# Patient Record
Sex: Male | Born: 1985 | Race: White | Hispanic: No | Smoking: Former smoker
Health system: Southern US, Community
[De-identification: ages and names within clinical notes are randomized; demographics above are authoritative.]

## PROBLEM LIST (undated history)

## (undated) DIAGNOSIS — M549 Dorsalgia, unspecified: Secondary | ICD-10-CM

## (undated) DIAGNOSIS — G4730 Sleep apnea, unspecified: Secondary | ICD-10-CM

## (undated) DIAGNOSIS — I10 Essential (primary) hypertension: Secondary | ICD-10-CM

## (undated) DIAGNOSIS — E782 Mixed hyperlipidemia: Secondary | ICD-10-CM

## (undated) DIAGNOSIS — M2550 Pain in unspecified joint: Secondary | ICD-10-CM

## (undated) DIAGNOSIS — M519 Unspecified thoracic, thoracolumbar and lumbosacral intervertebral disc disorder: Secondary | ICD-10-CM

## (undated) HISTORY — DX: Pain in unspecified joint: M25.50

## (undated) HISTORY — PX: KNEE SURGERY: SHX244

## (undated) HISTORY — PX: OTHER SURGICAL HISTORY: SHX169

## (undated) HISTORY — DX: Mixed hyperlipidemia: E78.2

## (undated) HISTORY — DX: Unspecified thoracic, thoracolumbar and lumbosacral intervertebral disc disorder: M51.9

## (undated) HISTORY — DX: Dorsalgia, unspecified: M54.9

## (undated) HISTORY — DX: Essential (primary) hypertension: I10

## (undated) HISTORY — DX: Sleep apnea, unspecified: G47.30

---

## 2011-09-12 ENCOUNTER — Ambulatory Visit
Admission: RE | Admit: 2011-09-12 | Discharge: 2011-09-12 | Disposition: A | Discharge status: Home / Self Care | Payer: PRIVATE HEALTH INSURANCE | Source: Ambulatory Visit | Attending: Occupational Medicine | Admitting: Occupational Medicine

## 2011-09-12 ENCOUNTER — Other Ambulatory Visit: Payer: Self-pay | Admitting: Occupational Medicine

## 2011-09-12 DIAGNOSIS — Z021 Encounter for pre-employment examination: Secondary | ICD-10-CM

## 2016-01-14 ENCOUNTER — Emergency Department (HOSPITAL_COMMUNITY): Payer: Worker's Compensation

## 2016-01-14 ENCOUNTER — Encounter (HOSPITAL_COMMUNITY): Payer: Self-pay | Admitting: Emergency Medicine

## 2016-01-14 ENCOUNTER — Emergency Department (HOSPITAL_COMMUNITY)
Admission: EM | Admit: 2016-01-14 | Discharge: 2016-01-14 | Disposition: A | Discharge status: Home / Self Care | Payer: Worker's Compensation | Attending: Emergency Medicine | Admitting: Emergency Medicine

## 2016-01-14 DIAGNOSIS — X58XXXA Exposure to other specified factors, initial encounter: Secondary | ICD-10-CM | POA: Diagnosis not present

## 2016-01-14 DIAGNOSIS — Z87891 Personal history of nicotine dependence: Secondary | ICD-10-CM | POA: Insufficient documentation

## 2016-01-14 DIAGNOSIS — Y9289 Other specified places as the place of occurrence of the external cause: Secondary | ICD-10-CM | POA: Diagnosis not present

## 2016-01-14 DIAGNOSIS — Y998 Other external cause status: Secondary | ICD-10-CM | POA: Diagnosis not present

## 2016-01-14 DIAGNOSIS — S8992XA Unspecified injury of left lower leg, initial encounter: Secondary | ICD-10-CM | POA: Diagnosis present

## 2016-01-14 DIAGNOSIS — S82102A Unspecified fracture of upper end of left tibia, initial encounter for closed fracture: Secondary | ICD-10-CM | POA: Diagnosis not present

## 2016-01-14 DIAGNOSIS — Y9339 Activity, other involving climbing, rappelling and jumping off: Secondary | ICD-10-CM | POA: Diagnosis not present

## 2016-01-14 DIAGNOSIS — M25562 Pain in left knee: Secondary | ICD-10-CM

## 2016-01-14 DIAGNOSIS — S82142A Displaced bicondylar fracture of left tibia, initial encounter for closed fracture: Secondary | ICD-10-CM

## 2016-01-14 MED ORDER — METHOCARBAMOL 500 MG PO TABS
1000.0000 mg | ORAL_TABLET | Freq: Once | ORAL | Status: AC
  Administered 2016-01-14: 1000 mg via ORAL
  Filled 2016-01-14: qty 2

## 2016-01-14 MED ORDER — KETOROLAC TROMETHAMINE 60 MG/2ML IM SOLN
60.0000 mg | Freq: Once | INTRAMUSCULAR | Status: AC
  Administered 2016-01-14: 60 mg via INTRAMUSCULAR
  Filled 2016-01-14: qty 2

## 2016-01-14 MED ORDER — IOPAMIDOL (ISOVUE-370) INJECTION 76%: 100.0000 mL | Freq: Once | INTRAVENOUS | Status: DC | PRN

## 2016-01-14 MED ORDER — NAPROXEN 500 MG PO TABS: 500.0000 mg | ORAL_TABLET | Freq: Two times a day (BID) | ORAL | Status: DC

## 2016-01-14 MED ORDER — OXYCODONE-ACETAMINOPHEN 5-325 MG PO TABS: 1.0000 | ORAL_TABLET | ORAL | Status: DC | PRN

## 2016-01-14 MED ORDER — METHOCARBAMOL 500 MG PO TABS: 500.0000 mg | ORAL_TABLET | Freq: Two times a day (BID) | ORAL | Status: DC

## 2016-01-14 NOTE — ED Provider Notes (Signed)
CSN: [id number]     Arrival date & time 01/14/16  1853 History   First MD Initiated Contact with Patient 01/14/16 1857     Chief Complaint  Patient presents with  . Knee Injury     (Consider location/radiation/quality/duration/timing/severity/associated sxs/prior Treatment) HPI   Jonathan GenerousRobert Hutchinson is a 30 y.o. male, patient with no pertinent past medical history, presenting to the ED with Left knee injury that occurred just prior to arrival. 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 Patient denies previous injury or surgery to this knee. Patient rates his pain at 2 out of 10 when he is not moving, aching, nonradiating. Patient states his pain increases to a 6 or 7 out of 10 upon weight-bearing or movement. Patient denies neuro deficits, head trauma, or any other injuries or complaints.  History reviewed. No pertinent past medical history. Past Surgical History  Procedure Laterality Date  . Testical removal     History reviewed. No pertinent family history. Social History  Substance Use Topics  . Smoking status: Former Games developermoker  . Smokeless tobacco: Never Used  . Alcohol Use: Yes    Review of Systems  Musculoskeletal: Positive for arthralgias (left knee). Negative for back pain and neck pain.  Neurological: Negative for weakness and numbness.      Allergies  Review of patient's allergies indicates no known allergies.  Home Medications   Prior to Admission medications   Medication Sig Start Date End Date Taking? Authorizing Provider  methocarbamol (ROBAXIN) 500 MG tablet Take 1 tablet (500 mg total) by mouth 2 (two) times daily. 01/14/16   Hye Trawick C Forever Arechiga, PA-C  naproxen (NAPROSYN) 500 MG tablet Take 1 tablet (500 mg total) by mouth 2 (two) times daily. 01/14/16   Kaelyn Nauta C Cincere Deprey, PA-C  oxyCODONE-acetaminophen (PERCOCET/ROXICET) 5-325 MG tablet Take 1 tablet  by mouth every 4 (four) hours as needed for severe pain. 01/14/16   Emilene Roma C Porchia Sinkler, PA-C   BP 126/79 mmHg  Pulse 123  Temp(Src) 99 F (37.2 C) (Oral)  Resp 18  SpO2 96% Physical Exam  Constitutional: He appears well-developed and well-nourished. No distress.  HENT:  Head: Normocephalic and atraumatic.  Eyes: Conjunctivae are normal.  Neck: Normal range of motion. Neck supple.  Cardiovascular: Normal rate, regular rhythm and intact distal pulses.   Pulmonary/Chest: Effort normal. No respiratory distress.  Musculoskeletal: He exhibits edema and tenderness.  Tenderness and swelling to the medial aspect of the right knee. Severe pain with valgus stress. No discernible deformity, crepitus, or wounds. Range of motion limited by pain. Circulation intact distally. Patient is nonweightbearing due to pain, even with neither valgus or varus stresses are applied.  Neurological: He is alert.  No sensory deficits. Strength 5 out of 5.  Skin: Skin is warm and dry. He is not diaphoretic.  Psychiatric: He has a normal mood and affect. His behavior is normal.  Nursing note and vitals reviewed.   ED Course  Procedures (including critical care time)  Imaging Review Ct Knee Left Wo Contrast  01/14/2016  CLINICAL DATA:  Status post fall onto left knee, with left knee pain and swelling. Initial encounter. EXAM: CT OF THE LEFT KNEE WITHOUT CONTRAST TECHNIQUE: Multidetector CT imaging of the left knee was performed according to the standard protocol. Multiplanar CT image reconstructions were also generated. COMPARISON:  Left knee radiographs performed earlier  today at 7:38 p.m. FINDINGS: There are multiple small fractures involving the proximal tibia and fibula. Numerous small fragments are seen arising from the anterior aspect of the medial tibial plateau, and a small osseous fragment is seen arising from the posterior aspect of the central edge of the medial tibial plateau. There is also a small avulsion fracture  arising from the lateral aspect of the proximal tibia, along a distal insertion of the lateral collateral ligament complex. In addition, small osseous fragments are seen arising from the fibular head, likely also reflecting lateral collateral ligament complex injury. There is mild cortical irregularity and flattening along the anterior aspect of the lateral tibial plateau, which may reflect remote injury. An associated moderate lipohemarthrosis is noted. Mild soft tissue injury is noted tracking about the patellar retinaculum. The vasculature is not fully assessed, but appears grossly unremarkable. The quadriceps and patellar tendons are grossly unremarkable. The menisci are not well assessed on this study. IMPRESSION: 1. Multiple small fractures involving the proximal tibia and fibula. 2. Numerous small fragments arising from the anterior aspect of the medial tibial plateau, and small osseous fragment arising from the posterior aspect of the central edge of the medial tibial plateau. Small avulsion fracture at the lateral aspect of the proximal tibia, along the distal insertion of the lateral collateral ligament complex. Small osseous fragments arising from the fibular head likely also reflect lateral collateral ligament complex injury. 3. Mild cortical irregularity and flattening along the anterior aspect of the lateral tibial plateau, which may reflect remote injury. 4. Moderate lipohemarthrosis noted. 5. Mild soft tissue injury about the patellar retinaculum. Electronically Signed   By: Roanna Raider M.D.   On: 01/14/2016 22:06   Dg Knee Complete 4 Views Left  01/14/2016  CLINICAL DATA:  Knee pain unable to bear weight. EXAM: LEFT KNEE - COMPLETE 4+ VIEW COMPARISON:  None. FINDINGS: No acute fracture or dislocation. Large joint effusion. No lytic or sclerotic osseous lesion. IMPRESSION: 1. No acute osseous injury of the left knee. 2. Large joint effusion. Electronically Signed   By: Elige Ko   On:  01/14/2016 19:43   I have personally reviewed and evaluated these images as part of my medical decision-making.   EKG Interpretation None      Medications  ketorolac (TORADOL) injection 60 mg (60 mg Intramuscular Given 01/14/16 1907)  methocarbamol (ROBAXIN) tablet 1,000 mg (1,000 mg Oral Given 01/14/16 1914)    MDM   Final diagnoses:  Left knee pain  Left knee injury, initial encounter  Tibial plateau fracture, left, closed, initial encounter    Jonathan Generous presents with left knee injury that occurred just prior to arrival.  Findings and plan of care discussed with Donnetta Hutching, MD. Dr. Adriana Simas personally evaluated and examined this patient.  Patient's physical exam gives concern for ligamentous injury or fracture. After the patient's exam, his lower leg muscles began to spasm painfully. Robaxin relieved these spasms. X-ray shows no fracture or dislocation, but shows a large joint effusion. Patient is still nonweightbearing due to pain. For this reason, clinician suspicion, and his other physical exam findings I believe this patient warrants a CT of the knee here in the ED. CT shows multiple small fractures throughout the proximal tibia. Dr. Adriana Simas recommends knee immobilizer and orthopedic follow-up. Patient advised to follow-up with orthopedics. Placed in knee immobilizer, given crutches, and prescriptions for pain management. Home care and return precautions discussed. Patient voiced understanding of these instructions and is comfortable with discharge. Note: Upon  discharge evaluation, patient was noted to be tachycardic. Patient was given a repeat physical exam and patient still has distal pulses in the left foot. The left knee has not increased in swelling. Compartments are soft. No evidence of internal hemorrhage. Patient denies lightheadedness/dizziness, nausea, or feeling abnormally. Patient denies pain. Suspect that the patient's pulse is increased due to lack of oral intake for the  multiple hours he has been here in the ED.  Filed Vitals:   01/14/16 1858 01/14/16 2256  BP: 146/99 126/79  Pulse: 90 123  Temp: 99.8 F (37.7 C) 99 F (37.2 C)  TempSrc: Oral   Resp: 14 18  SpO2: 95% 96%       Anselm Pancoast, PA-C 01/14/16 2231  Anselm Pancoast, PA-C 01/14/16 2307  Donnetta Hutching, MD 01/15/16 1600

## 2016-01-14 NOTE — ED Notes (Signed)
Pt in radiology 

## 2016-01-14 NOTE — Discharge Instructions (Signed)
You have been seen today for a knee injury. There are multiple small fractures in your lower leg bone. You must wear the immobilizer at all times unless you are dressing. Elevate the extremity, apply ice, and use anti-inflammatory medication such as naproxen or ibuprofen. Follow-up with orthopedics as soon as possible. Use the number provided to call and set up an appointment. Follow up with PCP as needed. Return to ED should symptoms worsen.

## 2016-01-14 NOTE — ED Notes (Signed)
Per Jonathan Hutchinson pt okay to discharge

## 2016-01-14 NOTE — ED Notes (Signed)
Pt [facility] [employer] with c/o left knee injury and pain s/p chasing a suspect.  Pt reports landing hard after jumping and hearing a "popping/crushing" noise followed by being unable to bear weight on the left leg.  Pt in NAD, A&O.

## 2016-10-25 IMAGING — CT CT KNEE*L* W/O CM
3 series · 14 of 33 positions shown, 17 images · non-contrast
Comparison: Left knee radiographs performed earlier today at [DATE]
p.m.

CLINICAL DATA: Status post fall onto left knee, with left knee pain
and swelling. Initial encounter.

EXAM:
CT OF THE LEFT KNEE WITHOUT CONTRAST
TECHNIQUE: Multidetector CT imaging of the left knee was performed according to
the standard protocol. Multiplanar CT image reconstructions were
also generated.

[Series 5: lower ext 1.5 st · axial · 0.46mm/px · z∈[-304,-107]mm · 6 of 172 slices shown, 8 images]
[im 27/172  soft-tissue]
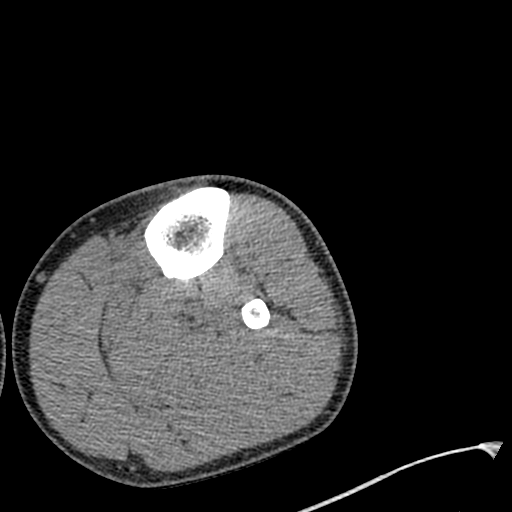
[im 27/172  bone]
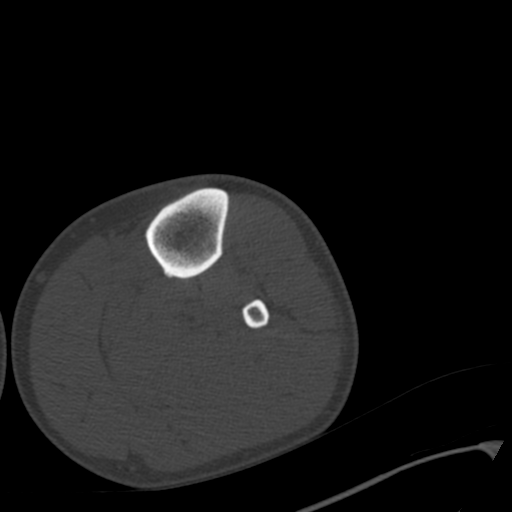
[im 53/172  bone]
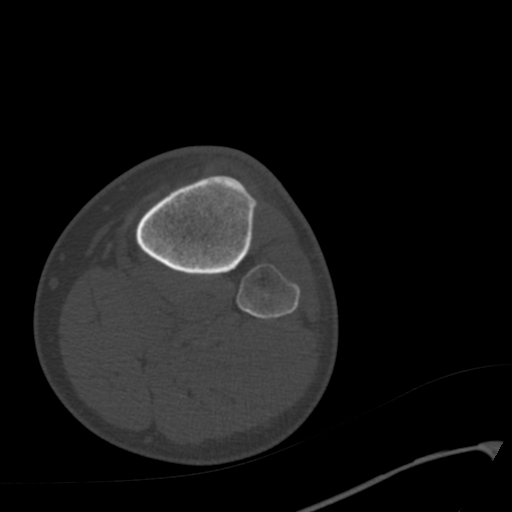
[im 79/172  bone]
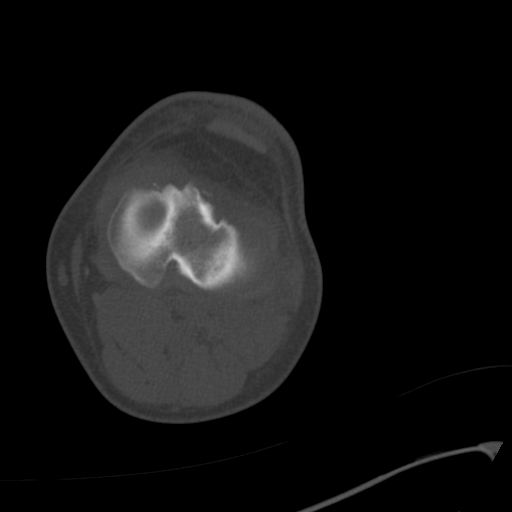
[im 106/172  bone]
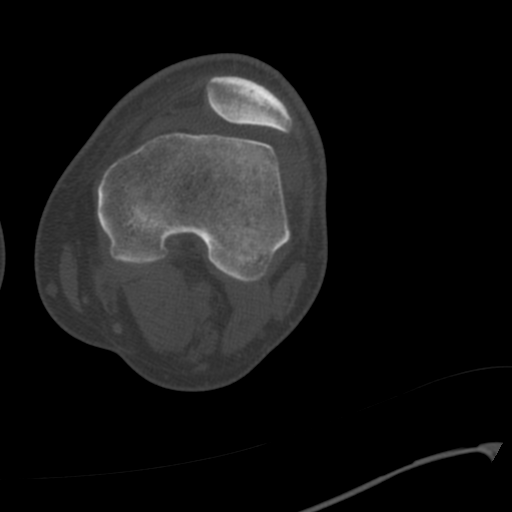
[im 132/172  soft-tissue]
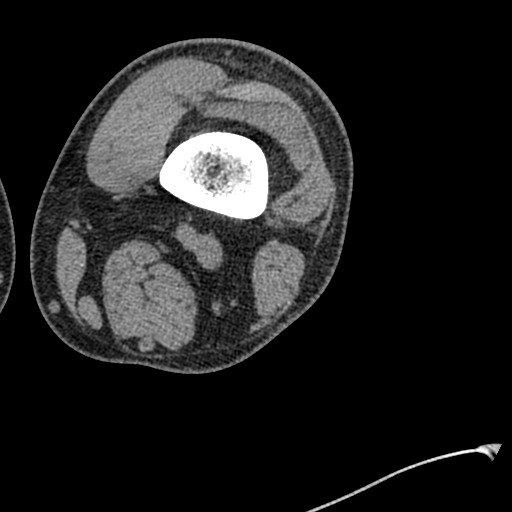
[im 132/172  bone]
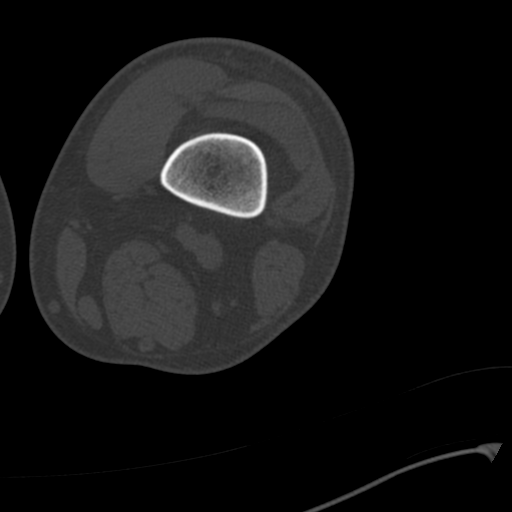
[im 158/172  bone]
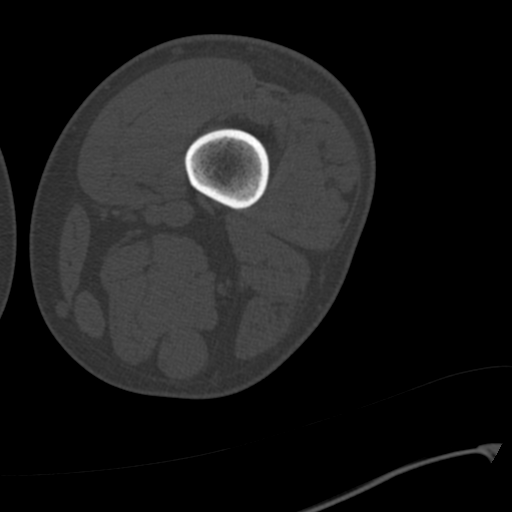

[Series 10: lower ext cor st · coronal · 0.35mm/px · 3 of 102 slices shown]
[im 21/102  bone]
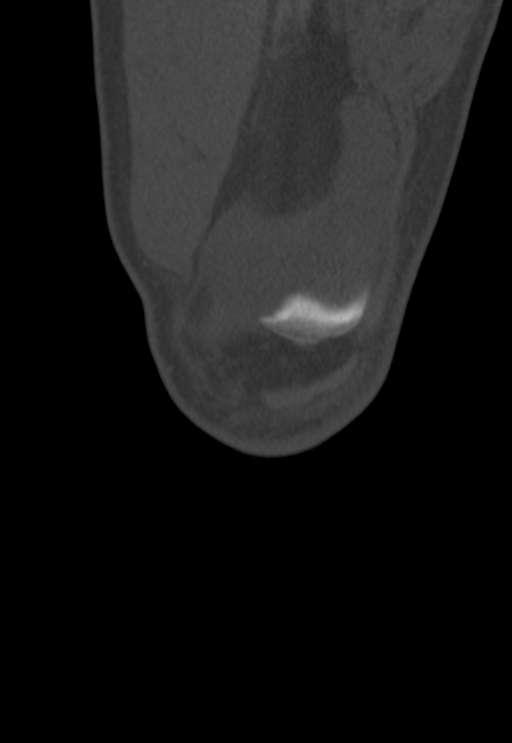
[im 41/102  bone]
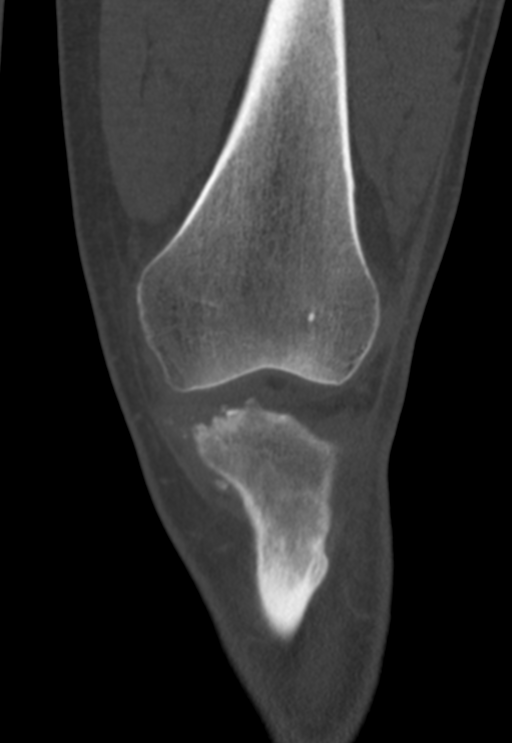
[im 61/102  bone]
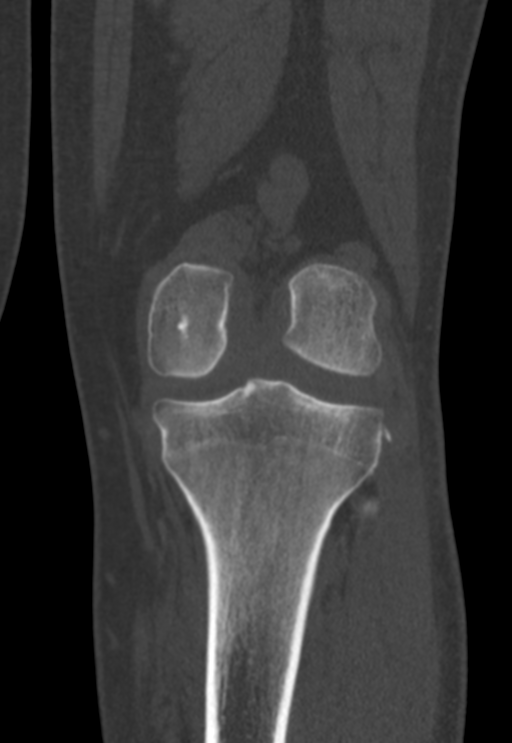

[Series 11: lower ext sag st · sagittal · 0.47mm/px · 5 of 95 slices shown, 6 images]
[im 32/95  bone]
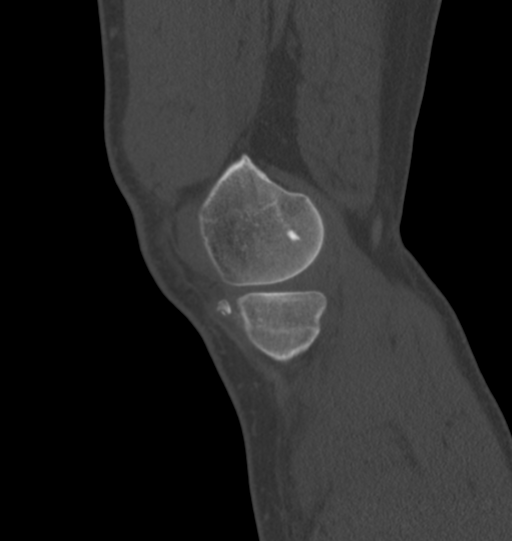
[im 40/95  bone]
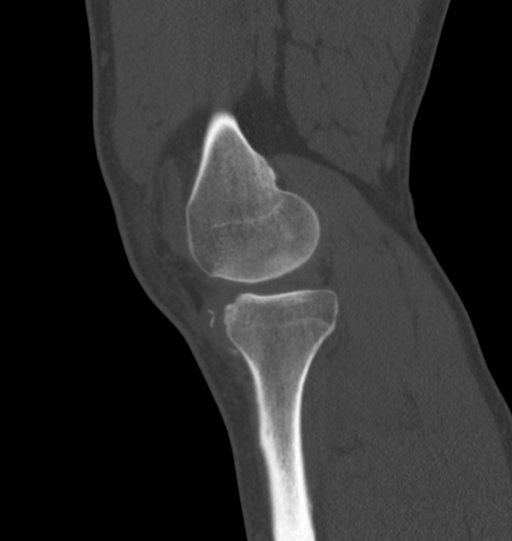
[im 48/95  soft-tissue]
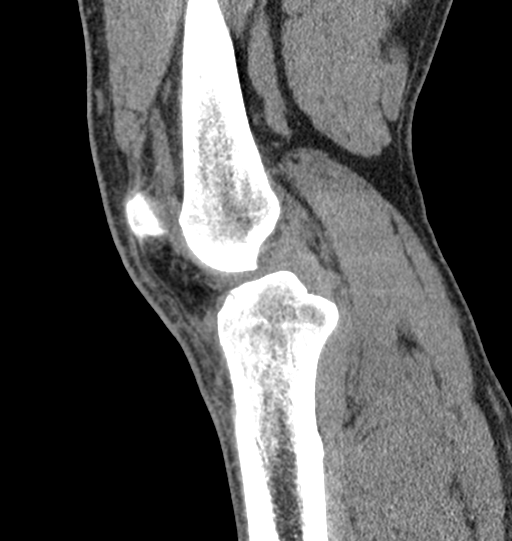
[im 48/95  bone]
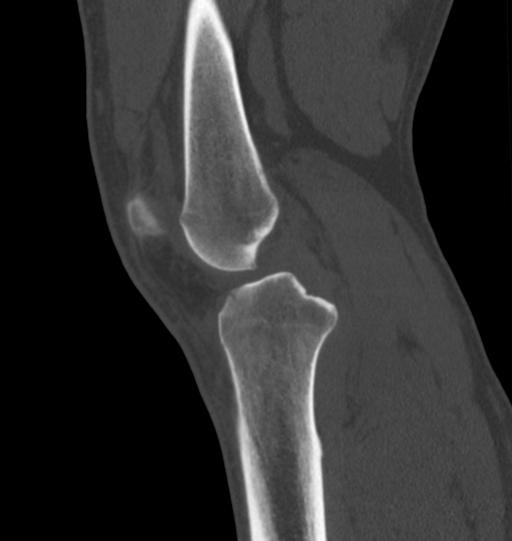
[im 55/95  bone]
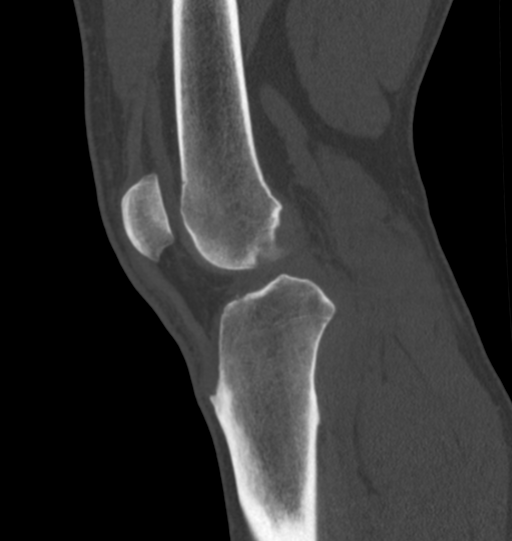
[im 63/95  bone]
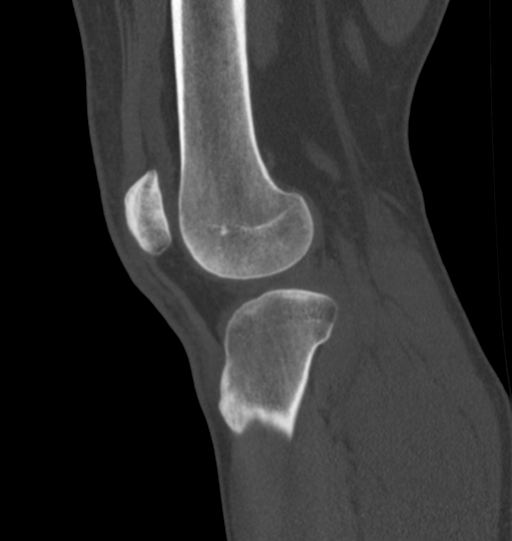

[14 of 33 positions shown; findings below may reference images not displayed]

FINDINGS: There are multiple small fractures involving the proximal tibia and
fibula. Numerous small fragments are seen arising from the anterior
aspect of the medial tibial plateau, and a small osseous fragment is
seen arising from the posterior aspect of the central edge of the
medial tibial plateau. There is also a small avulsion fracture
arising from the lateral aspect of the proximal tibia, along a
distal insertion of the lateral collateral ligament complex. In
addition, small osseous fragments are seen arising from the fibular
head, likely also reflecting lateral collateral ligament complex
injury.

There is mild cortical irregularity and flattening along the
anterior aspect of the lateral tibial plateau, which may reflect
remote injury. An associated moderate lipohemarthrosis is noted.

Mild soft tissue injury is noted tracking about the patellar
retinaculum. The vasculature is not fully assessed, but appears
grossly unremarkable. The quadriceps and patellar tendons are
grossly unremarkable. The menisci are not well assessed on this
study.
IMPRESSION: 1. Multiple small fractures involving the proximal tibia and fibula.
2. Numerous small fragments arising from the anterior aspect of the
medial tibial plateau, and small osseous fragment arising from the
posterior aspect of the central edge of the medial tibial plateau.
Small avulsion fracture at the lateral aspect of the proximal tibia,
along the distal insertion of the lateral collateral ligament
complex. Small osseous fragments arising from the fibular head
likely also reflect lateral collateral ligament complex injury.
3. Mild cortical irregularity and flattening along the anterior
aspect of the lateral tibial plateau, which may reflect remote
injury.
4. Moderate lipohemarthrosis noted.
5. Mild soft tissue injury about the patellar retinaculum.

## 2019-06-04 ENCOUNTER — Other Ambulatory Visit: Payer: Self-pay

## 2019-06-04 ENCOUNTER — Emergency Department (HOSPITAL_COMMUNITY)
Admission: EM | Admit: 2019-06-04 | Discharge: 2019-06-04 | Disposition: A | Discharge status: Home / Self Care | Payer: No Typology Code available for payment source | Attending: Emergency Medicine | Admitting: Emergency Medicine

## 2019-06-04 ENCOUNTER — Encounter (HOSPITAL_COMMUNITY): Payer: Self-pay | Admitting: Emergency Medicine

## 2019-06-04 DIAGNOSIS — Y990 Civilian activity done for income or pay: Secondary | ICD-10-CM | POA: Diagnosis not present

## 2019-06-04 DIAGNOSIS — Z23 Encounter for immunization: Secondary | ICD-10-CM | POA: Diagnosis not present

## 2019-06-04 DIAGNOSIS — Z87891 Personal history of nicotine dependence: Secondary | ICD-10-CM | POA: Insufficient documentation

## 2019-06-04 DIAGNOSIS — W540XXA Bitten by dog, initial encounter: Secondary | ICD-10-CM | POA: Insufficient documentation

## 2019-06-04 DIAGNOSIS — S61230A Puncture wound without foreign body of right index finger without damage to nail, initial encounter: Secondary | ICD-10-CM | POA: Insufficient documentation

## 2019-06-04 DIAGNOSIS — Y9389 Activity, other specified: Secondary | ICD-10-CM | POA: Diagnosis not present

## 2019-06-04 DIAGNOSIS — Y92009 Unspecified place in unspecified non-institutional (private) residence as the place of occurrence of the external cause: Secondary | ICD-10-CM | POA: Insufficient documentation

## 2019-06-04 MED ORDER — AMOXICILLIN-POT CLAVULANATE 875-125 MG PO TABS: 1.0000 | ORAL_TABLET | Freq: Two times a day (BID) | ORAL | 0 refills | Status: AC

## 2019-06-04 MED ORDER — ACETAMINOPHEN 325 MG PO TABS
650.0000 mg | ORAL_TABLET | Freq: Once | ORAL | Status: AC
  Administered 2019-06-04: 650 mg via ORAL
  Filled 2019-06-04: qty 2

## 2019-06-04 MED ORDER — TETANUS-DIPHTH-ACELL PERTUSSIS 5-2.5-18.5 LF-MCG/0.5 IM SUSP
0.5000 mL | Freq: Once | INTRAMUSCULAR | Status: AC
  Administered 2019-06-04: 0.5 mL via INTRAMUSCULAR
  Filled 2019-06-04: qty 0.5

## 2019-06-04 NOTE — ED Notes (Signed)
signature pad not available at d/c, pt verbalized understanding

## 2019-06-04 NOTE — ED Triage Notes (Signed)
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 Animal control called and at scene. Unsure of rabies shots yet. Patient denies any pain.

## 2019-06-04 NOTE — Discharge Instructions (Signed)
You received a tetanus booster on today's visit.  I have prescribed antibiotics to help prevent any further infection of your dog bite, please take 1 tablet twice a day for the next 7 days.  Please follow-up with your primary care physician for your tachycardia, if you experience any chest pain, shortness of breath please return to the emergency department.

## 2019-06-04 NOTE — ED Provider Notes (Signed)
[facility] Provider Note   CSN: [id number] Arrival date & time: 06/04/19  1900     History   Chief Complaint Chief Complaint  Patient presents with  . Animal Bite    HPI Jonathan Hutchinson is a 33 y.o. male.     33 y.o male with no PMH presents to the ED s/p dog bite.  Patient is currently employed by [employer], reports they went into a home this afternoon when 1 of the dogs a pitbull mix was excited, proceeded to bite him on his right index finger, he reports there was no bleeding at the time of the event.  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  Patient does not know when his last tetanus vaccine was obtained.  He would not like to obtain rabies prophylactic therapy at this time.  Denies any other injuries or complaints. Of note, patient was tachycardic during his visit, heart rate was 131, he reports his heart rate usually runs around 110 at rest.   Animal Bite Associated symptoms: no fever     History reviewed. No pertinent past medical history.  There are no active problems to display for this patient.   Past Surgical History:  Procedure Laterality Date  . KNEE SURGERY Right   . testical removal          Home Medications    Prior to Admission medications   Medication Sig Start Date End Date Taking? Authorizing Provider  methocarbamol (ROBAXIN) 500 MG tablet Take 1 tablet (500 mg total) by mouth 2 (two) times daily. 01/14/16   Joy, Shawn C, PA-C  naproxen (NAPROSYN) 500 MG tablet Take 1 tablet (500 mg total) by mouth 2 (two) times daily. 01/14/16   Joy, Shawn C, PA-C  oxyCODONE-acetaminophen (PERCOCET/ROXICET) 5-325 MG tablet Take 1 tablet by mouth every 4 (four) hours as needed for severe pain. 01/14/16   Joy, Hillard DankerShawn C, PA-C    Family History No family history on file.  Social History Social History   Tobacco Use  . Smoking status: Former Games developermoker  .  Smokeless tobacco: Never Used  Substance Use Topics  . Alcohol use: Yes  . Drug use: No     Allergies   Patient has no known allergies.   Review of Systems Review of Systems  Constitutional: Negative for fever.  Skin: Positive for wound.     Physical Exam Updated Vital Signs BP 135/76 (BP Location: Left Arm)   Pulse (!) 131   Temp 98.6 F (37 C) (Oral)   Resp 18   SpO2 97%   Physical Exam Vitals signs and nursing note reviewed.  Constitutional:      Appearance: He is well-developed.  HENT:     Head: Normocephalic and atraumatic.  Eyes:     General: No scleral icterus.    Pupils: Pupils are equal, round, and reactive to light.  Neck:     Musculoskeletal: Normal range of motion.  Cardiovascular:     Heart sounds: Normal heart sounds.  Pulmonary:     Effort: Pulmonary effort is normal.     Breath sounds: Normal breath sounds. No wheezing.  Chest:     Chest wall: No tenderness.  Abdominal:     General: Bowel sounds are normal. There is no distension.     Palpations: Abdomen is soft.     Tenderness: There is no abdominal tenderness.  Musculoskeletal:  General: No tenderness or deformity.       Hands:  Skin:    General: Skin is warm and dry.  Neurological:     Mental Status: He is alert and oriented to person, place, and time.      ED Treatments / Results  Labs (all labs ordered are listed, but only abnormal results are displayed) Labs Reviewed - No data to display  EKG None  Radiology No results found.  Procedures Procedures (including critical care time)  Medications Ordered in ED Medications - No data to display   Initial Impression / Assessment and Plan / ED Course  I have reviewed the triage vital signs and the nursing notes.  Pertinent labs & imaging results that were available during my care of the patient were reviewed by me and considered in my medical decision making (see chart for details).       Patient presents to the  ED status post dog bite, currently a [employer], was going into a home when a dog bit him on his right index finger, pinpoint wound noted to the area.  It was extensively irrigated while in the ED, unknown tetanus status.  Patient received an update of tetanus on today's visit, wound appears a small and dog has been contained, discussed risks and benefits of obtaining rabies prophylactic therapy at this time.  He reports he would like to have the dog contained and does not want any rabies vaccinations at this time.  Will defer this on patient's request.  On today's visit patient was found to be persistently tachycardic arrived in the ED with a heart rate of 130, was then evaluated by me several times with the lowest heart rate in the 118's, an EKG was obtained to further evaluate his condition which did not show any changes consistent with STEMI or infarct.  He was given Tylenol along with water in hopes to bring his tachycardia down.  He denied any shortness of breath, chest pain, headaches, any discomfort.  According to patient he usually runs around 110 at rest, has a PCP I have encouraged follow-up for him.  Due to these being a dog bite I have placed patient on Augmentin, he was explained on taking this antibiotic to prevent any further infection.  Patient understands and agrees with management.  He is stable for discharge.  Return precautions discussed at length  Portions of this note were generated with Dragon dictation software. Dictation errors may occur despite best attempts at proofreading.  Final Clinical Impressions(s) / ED Diagnoses   Final diagnoses:  Dog bite, initial encounter    ED Discharge Orders    None       Janeece Fitting, Hershal Coria 06/04/19 2113    Sherwood Gambler, MD 06/07/19 1504

## 2019-06-22 ENCOUNTER — Other Ambulatory Visit: Payer: Self-pay

## 2019-06-23 ENCOUNTER — Ambulatory Visit: Payer: 59 | Admitting: Family Medicine

## 2019-06-23 ENCOUNTER — Encounter: Payer: Self-pay | Admitting: Family Medicine

## 2019-06-23 VITALS — BP 130/84 | HR 108 | Temp 97.2°F | Ht 71.0 in | Wt 292.4 lb

## 2019-06-23 DIAGNOSIS — Z0000 Encounter for general adult medical examination without abnormal findings: Secondary | ICD-10-CM

## 2019-06-23 DIAGNOSIS — Z1283 Encounter for screening for malignant neoplasm of skin: Secondary | ICD-10-CM | POA: Diagnosis not present

## 2019-06-23 NOTE — Patient Instructions (Addendum)
I recommend getting the flu shot in mid October. This suggestion would change if the CDC comes out with a different recommendation.   Give us 2-3 business days to get the results of your labs back.   Keep the diet clean and stay active.  Do monthly self testicular checks in the shower. You are feeling for lumps/bumps that don't belong. If you feel anything like this, let me know!  Aim to do some physical exertion for 150 minutes per week. This is typically divided into 5 days per week, 30 minutes per day. The activity should be enough to get your heart rate up. Anything is better than nothing if you have time constraints.  Healthy Eating Plan Many factors influence your heart health, including eating and exercise habits. Heart (coronary) risk increases with abnormal blood fat (lipid) levels. Heart-healthy meal planning includes limiting unhealthy fats, increasing healthy fats, and making other small dietary changes. This includes maintaining a healthy body weight to help keep lipid levels within a normal range.  WHAT IS MY PLAN?  Your health care provider recommends that you:  Drink a glass of water before meals to help with satiety.  Eat slowly.  An alternative to the water is to add Metamucil. This will help with satiety as well. It does contain calories, unlike water.  WHAT TYPES OF FAT SHOULD I CHOOSE?  Choose healthy fats more often. Choose monounsaturated and polyunsaturated fats, such as olive oil and canola oil, flaxseeds, walnuts, almonds, and seeds.  Eat more omega-3 fats. Good choices include salmon, mackerel, sardines, tuna, flaxseed oil, and ground flaxseeds. Aim to eat fish at least two times each week.  Avoid foods with partially hydrogenated oils in them. These contain trans fats. Examples of foods that contain trans fats are stick margarine, some tub margarines, cookies, crackers, and other baked goods. If you are going to avoid a fat, this is the one to avoid!  WHAT  GENERAL GUIDELINES DO I NEED TO FOLLOW?  Check food labels carefully to identify foods with trans fats. Avoid these types of options when possible.  Fill one half of your plate with vegetables and green salads. Eat 4-5 servings of vegetables per day. A serving of vegetables equals 1 cup of raw leafy vegetables,  cup of raw or cooked cut-up vegetables, or  cup of vegetable juice.  Fill one fourth of your plate with whole grains. Look for the word "whole" as the first word in the ingredient list.  Fill one fourth of your plate with lean protein foods.  Eat 4-5 servings of fruit per day. A serving of fruit equals one medium whole fruit,  cup of dried fruit,  cup of fresh, frozen, or canned fruit. Try to avoid fruits in cups/syrups as the sugar content can be high.  Eat more foods that contain soluble fiber. Examples of foods that contain this type of fiber are apples, broccoli, carrots, beans, peas, and barley. Aim to get 20-30 g of fiber per day.  Eat more home-cooked food and less restaurant, buffet, and fast food.  Limit or avoid alcohol.  Limit foods that are high in starch and sugar.  Avoid fried foods when able.  Cook foods by using methods other than frying. Baking, boiling, grilling, and broiling are all great options. Other fat-reducing suggestions include: ? Removing the skin from poultry. ? Removing all visible fats from meats. ? Skimming the fat off of stews, soups, and gravies before serving them. ? Steaming vegetables in water or  broth.  Lose weight if you are overweight. Losing just 5-10% of your initial body weight can help your overall health and prevent diseases such as diabetes and heart disease.  Increase your consumption of nuts, legumes, and seeds to 4-5 servings per week. One serving of dried beans or legumes equals  cup after being cooked, one serving of nuts equals 1 ounces, and one serving of seeds equals  ounce or 1 tablespoon.  WHAT ARE GOOD FOODS CAN I  EAT? Grains Grainy breads (try to find bread that is 3 g of fiber per slice or greater), oatmeal, light popcorn. Whole-grain cereals. Rice and pasta, including brown rice and those that are made with whole wheat. Edamame pasta is a great alternative to grain pasta. It has a higher protein content. Try to avoid significant consumption of white bread, sugary cereals, or pastries/baked goods.  Vegetables All vegetables. Cooked white potatoes do not count as vegetables.  Fruits All fruits, but limit pineapple and bananas as these fruits have a higher sugar content.  Meats and Other Protein Sources Lean, well-trimmed beef, veal, pork, and lamb. Chicken and [place] without skin. All fish and shellfish. Wild duck, rabbit, pheasant, and venison. Egg whites or low-cholesterol egg substitutes. Dried beans, peas, lentils, and tofu.Seeds and most nuts.  Dairy Low-fat or nonfat cheeses, including ricotta, string, and mozzarella. Skim or 1% milk that is liquid, powdered, or evaporated. Buttermilk that is made with low-fat milk. Nonfat or low-fat yogurt. Soy/Almond milk are good alternatives if you cannot handle dairy.  Beverages Water is the best for you. Sports drinks with less sugar are more desirable unless you are a highly active athlete.  Sweets and Desserts Sherbets and fruit ices. Honey, jam, marmalade, jelly, and syrups. Dark chocolate.  Eat all sweets and desserts in moderation.  Fats and Oils Nonhydrogenated (trans-free) margarines. Vegetable oils, including soybean, sesame, sunflower, olive, peanut, safflower, corn, canola, and cottonseed. Salad dressings or mayonnaise that are made with a vegetable oil. Limit added fats and oils that you use for cooking, baking, salads, and as spreads.  Other Cocoa powder. Coffee and tea. Most condiments.  The items listed above may not be a complete list of recommended foods or beverages. Contact your dietitian for more options.

## 2019-06-23 NOTE — Progress Notes (Signed)
Chief Complaint  Patient presents with  . New Patient (Initial Visit)    needs a physical    Well Male Jonathan Hutchinson is here for a complete physical.   His last physical was >1 year ago.  Current diet: in general, diet could be better.   Current exercise: none Weight trend: stable Daytime fatigue? No. Seat belt? Yes.    Health maintenance Tetanus- Yes HIV- Yes  History reviewed. No pertinent past medical history.   Past Surgical History:  Procedure Laterality Date  . KNEE SURGERY Right   . testical removal     Removed Left    Medications  Takes no meds routinely.   Allergies No Known Allergies  Family History Family History  Problem Relation Age of Onset  . Hypertension Father   . Hyperlipidemia Father     Review of Systems: Constitutional: no fevers or chills Eye:  no recent significant change in vision Ear/Nose/Mouth/Throat:  Ears:  no tinnitus or hearing loss Nose/Mouth/Throat:  no complaints of nasal congestion, no sore throat Cardiovascular:  no chest pain, no palpitations Respiratory:  no cough and no shortness of breath Gastrointestinal:  no abdominal pain, no change in bowel habits GU:  Male: negative for dysuria, frequency, and incontinence and negative for prostate symptoms Musculoskeletal/Extremities:  no pain, redness, or swelling of the joints Integumentary (Skin/Breast): +various skin tags and moles; otherwise no abnormal skin lesions reported Neurologic:  no headaches, no numbness, tingling Endocrine: No unexpected weight changes Hematologic/Lymphatic:  no night sweats  Exam BP 130/84 (BP Location: Left Arm, Patient Position: Sitting, Cuff Size: Large)   Pulse (!) 108   Temp (!) 97.2 F (36.2 C) (Temporal)   Ht 5\' 11"  (1.803 m)   Wt 292 lb 6 oz (132.6 kg)   SpO2 96%   BMI 40.78 kg/m  General:  well developed, well nourished, in no apparent distress Skin: Various skin tags, macules, angiomas; otherwise no significant moles, warts, or  growths Head:  no masses, lesions, or tenderness Eyes:  pupils equal and round, sclera anicteric without injection Ears:  canals without lesions, TMs shiny without retraction, no obvious effusion, no erythema Nose:  nares patent, septum midline, mucosa normal Throat/Pharynx:  lips and gingiva without lesion; tongue and uvula midline; non-inflamed pharynx; no exudates or postnasal drainage Neck: neck supple without adenopathy, thyromegaly, or masses Lungs:  clear to auscultation, breath sounds equal bilaterally, no respiratory distress Cardio:  regular rate and rhythm, no bruits, no LE edema Abdomen:  abdomen soft, nontender; bowel sounds normal; no masses or organomegaly Genital (male): Uncircumcised penis, no lesions or discharge; testicle present on R only without masses or tenderness Rectal: Deferred Musculoskeletal:  symmetrical muscle groups noted without atrophy or deformity Extremities:  no clubbing, cyanosis, or edema, no deformities, no skin discoloration Neuro:  gait normal; deep tendon reflexes normal and symmetric Psych: well oriented with normal range of affect and appropriate judgment/insight  Assessment and Plan  Well adult exam - Plan: CBC, Comprehensive metabolic panel, Lipid panel, T4, free, TSH  Skin cancer screening - Plan: Ambulatory referral to Dermatology  Morbid obesity [facility])   Well 33 y.o. male. Counseled on diet and exercise. EKG from ED neg for worrisome arrhythmia.  Counseled on testicular cancer screening.  Flu shot in Oct. Other orders as above. Follow up in 1 year pending the above workup. The patient voiced understanding and agreement to the plan.  [name], DO 06/23/19 4:04 PM

## 2019-07-02 ENCOUNTER — Other Ambulatory Visit: Payer: Self-pay

## 2019-07-02 ENCOUNTER — Other Ambulatory Visit (INDEPENDENT_AMBULATORY_CARE_PROVIDER_SITE_OTHER): Payer: 59

## 2019-07-02 DIAGNOSIS — Z0000 Encounter for general adult medical examination without abnormal findings: Secondary | ICD-10-CM

## 2019-07-02 LAB — COMPREHENSIVE METABOLIC PANEL
ALT: 66 U/L — ABNORMAL HIGH (ref 0–53)
AST: 36 U/L (ref 0–37)
Albumin: 5 g/dL (ref 3.5–5.2)
Alkaline Phosphatase: 67 U/L (ref 39–117)
BUN: 14 mg/dL (ref 6–23)
CO2: 27 mEq/L (ref 19–32)
Calcium: 9.8 mg/dL (ref 8.4–10.5)
Chloride: 103 mEq/L (ref 96–112)
Creatinine, Ser: 0.99 mg/dL (ref 0.40–1.50)
GFR: 86.68 mL/min (ref >60.00)
Glucose, Bld: 88 mg/dL (ref 70–99)
Potassium: 4.1 mEq/L (ref 3.5–5.1)
Sodium: 139 mEq/L (ref 135–145)
Total Bilirubin: 1.4 mg/dL — ABNORMAL HIGH (ref 0.2–1.2)
Total Protein: 7.4 g/dL (ref 6.0–8.3)

## 2019-07-02 LAB — LIPID PANEL
Cholesterol: 204 mg/dL — ABNORMAL HIGH (ref 0–200)
HDL: 28.3 mg/dL — ABNORMAL LOW (ref >39.00)
NonHDL: 175.43
Total CHOL/HDL Ratio: 7
Triglycerides: 250 mg/dL — ABNORMAL HIGH (ref 0.0–149.0)
VLDL: 50 mg/dL — ABNORMAL HIGH (ref 0.0–40.0)

## 2019-07-02 LAB — LDL CHOLESTEROL, DIRECT: Direct LDL: 130 mg/dL

## 2019-07-02 LAB — CBC
HCT: 47.5 % (ref 39.0–52.0)
Hemoglobin: 16.3 g/dL (ref 13.0–17.0)
MCHC: 34.3 g/dL (ref 30.0–36.0)
MCV: 87.3 fl (ref 78.0–100.0)
Platelets: 249 10*3/uL (ref 150.0–400.0)
RBC: 5.44 Mil/uL (ref 4.22–5.81)
RDW: 13.9 % (ref 11.5–15.5)
WBC: 7.5 10*3/uL (ref 4.0–10.5)

## 2019-07-02 LAB — TSH: TSH: 1.48 u[IU]/mL (ref 0.35–4.50)

## 2019-07-02 LAB — T4, FREE: Free T4: 1.14 ng/dL (ref 0.60–1.60)

## 2019-07-05 ENCOUNTER — Other Ambulatory Visit: Payer: Self-pay | Admitting: Family Medicine

## 2019-07-05 DIAGNOSIS — R945 Abnormal results of liver function studies: Secondary | ICD-10-CM

## 2019-07-05 DIAGNOSIS — E7849 Other hyperlipidemia: Secondary | ICD-10-CM

## 2019-08-03 ENCOUNTER — Other Ambulatory Visit (INDEPENDENT_AMBULATORY_CARE_PROVIDER_SITE_OTHER): Payer: 59

## 2019-08-03 ENCOUNTER — Other Ambulatory Visit: Payer: Self-pay

## 2019-08-03 DIAGNOSIS — R945 Abnormal results of liver function studies: Secondary | ICD-10-CM | POA: Diagnosis not present

## 2019-08-03 DIAGNOSIS — E7849 Other hyperlipidemia: Secondary | ICD-10-CM

## 2019-08-03 LAB — HEPATIC FUNCTION PANEL
ALT: 33 U/L (ref 0–53)
AST: 20 U/L (ref 0–37)
Albumin: 5.1 g/dL (ref 3.5–5.2)
Alkaline Phosphatase: 72 U/L (ref 39–117)
Bilirubin, Direct: 0.2 mg/dL (ref 0.0–0.3)
Total Bilirubin: 1.1 mg/dL (ref 0.2–1.2)
Total Protein: 7.2 g/dL (ref 6.0–8.3)

## 2019-08-03 LAB — LIPID PANEL
Cholesterol: 185 mg/dL (ref 0–200)
HDL: 26.9 mg/dL — ABNORMAL LOW (ref >39.00)
NonHDL: 157.99
Total CHOL/HDL Ratio: 7
Triglycerides: 325 mg/dL — ABNORMAL HIGH (ref 0.0–149.0)
VLDL: 65 mg/dL — ABNORMAL HIGH (ref 0.0–40.0)

## 2019-08-03 LAB — LDL CHOLESTEROL, DIRECT: Direct LDL: 103 mg/dL

## 2019-08-11 ENCOUNTER — Other Ambulatory Visit: Payer: Self-pay | Admitting: Family Medicine

## 2019-08-11 ENCOUNTER — Telehealth: Payer: Self-pay | Admitting: Family Medicine

## 2019-08-11 DIAGNOSIS — E7849 Other hyperlipidemia: Secondary | ICD-10-CM

## 2019-08-11 MED ORDER — ROSUVASTATIN CALCIUM 10 MG PO TABS: 10.0000 mg | ORAL_TABLET | Freq: Every day | ORAL | 2 refills | Status: DC

## 2019-08-11 NOTE — Progress Notes (Signed)
lipi

## 2019-08-11 NOTE — Telephone Encounter (Signed)
Sent!

## 2019-08-11 NOTE — Telephone Encounter (Signed)
If he is agreeable, I will call in a cholesterol medication to take daily.  We will need to recheck his cholesterol levels in 6 weeks if he decides to do so.  Thank you.

## 2019-08-11 NOTE — Addendum Note (Signed)
Addended by: Ames Coupe on: 08/11/2019 01:33 PM   Modules accepted: Orders

## 2019-08-11 NOTE — Telephone Encounter (Signed)
Copied from [place] [id number]. Topic: General - Inquiry >> Aug 11, 2019 10:50 AM Reyne Dumas L wrote: Reason for CRM:   Pt states that he had lab work done and in the result notes it states he may need to start new medication.  Pt wants to know what medication needs to be started and what to do about that. Pt can be reached at [phone].

## 2019-08-11 NOTE — Telephone Encounter (Signed)
Yes please send in medication Lab ordered for 6 weeks.

## 2019-09-23 ENCOUNTER — Other Ambulatory Visit (INDEPENDENT_AMBULATORY_CARE_PROVIDER_SITE_OTHER): Payer: 59

## 2019-09-23 ENCOUNTER — Other Ambulatory Visit: Payer: Self-pay

## 2019-09-23 DIAGNOSIS — E7849 Other hyperlipidemia: Secondary | ICD-10-CM | POA: Diagnosis not present

## 2019-09-23 LAB — LIPID PANEL
Cholesterol: 133 mg/dL (ref 0–200)
HDL: 30.8 mg/dL — ABNORMAL LOW (ref >39.00)
LDL Cholesterol: 68 mg/dL (ref 0–99)
NonHDL: 101.77
Total CHOL/HDL Ratio: 4
Triglycerides: 170 mg/dL — ABNORMAL HIGH (ref 0.0–149.0)
VLDL: 34 mg/dL (ref 0.0–40.0)

## 2019-11-23 ENCOUNTER — Telehealth: Payer: Self-pay

## 2019-11-23 MED ORDER — ROSUVASTATIN CALCIUM 10 MG PO TABS: 10.0000 mg | ORAL_TABLET | Freq: Every day | ORAL | 2 refills | Status: DC

## 2019-11-23 NOTE — Telephone Encounter (Signed)
Patient called in to see if Dr. Carmelia Hutchinson can send in a prescription for rosuvastatin (CRESTOR) 10 MG tablet [[id number]]    Patient will run out before the next Doctors visit.   Thanks,

## 2019-11-23 NOTE — Telephone Encounter (Signed)
Refill done.  

## 2020-02-11 ENCOUNTER — Ambulatory Visit: Payer: 59 | Admitting: Family Medicine

## 2020-02-11 ENCOUNTER — Encounter: Payer: Self-pay | Admitting: Family Medicine

## 2020-02-11 ENCOUNTER — Other Ambulatory Visit: Payer: Self-pay | Admitting: Family Medicine

## 2020-02-11 ENCOUNTER — Other Ambulatory Visit: Payer: Self-pay

## 2020-02-11 VITALS — BP 120/84 | HR 85 | Temp 95.9°F | Ht 71.0 in | Wt 262.4 lb

## 2020-02-11 DIAGNOSIS — E782 Mixed hyperlipidemia: Secondary | ICD-10-CM | POA: Diagnosis not present

## 2020-02-11 LAB — LIPID PANEL
Cholesterol: 181 mg/dL (ref 0–200)
HDL: 26.5 mg/dL — ABNORMAL LOW (ref >39.00)
Total CHOL/HDL Ratio: 7
Triglycerides: 467 mg/dL — ABNORMAL HIGH (ref 0.0–149.0)

## 2020-02-11 LAB — LDL CHOLESTEROL, DIRECT: Direct LDL: 70 mg/dL

## 2020-02-11 MED ORDER — ROSUVASTATIN CALCIUM 10 MG PO TABS: 10.0000 mg | ORAL_TABLET | Freq: Every day | ORAL | 10 refills | Status: DC

## 2020-02-11 NOTE — Progress Notes (Signed)
Chief Complaint  Patient presents with  . Follow-up    6 month    Subjective: Hyperlipidemia Patient presents for Hyperlipidemia follow up. Currently taking Crestor 10 mg/d and compliance with treatment thus far has been poor. He denies myalgias. He is adhering to a healthy diet. Exercise: none The patient is not known to have coexisting coronary artery disease.  Med hx Obesity Mixed hyperlipidemia  Objective: BP 120/84 (BP Location: Left Arm, Patient Position: Sitting, Cuff Size: Normal)   Pulse 85   Temp (!) 95.9 F (35.5 C) (Temporal)   Ht 5\' 11"  (1.803 m)   Wt 262 lb 6 oz (119 kg)   SpO2 95%   BMI 36.59 kg/m  General: Awake, appears stated age HEENT: MMM Heart: RRR, no LE edema, no bruits Lungs: CTAB, no rales, wheezes or rhonchi. No accessory muscle use Psych: Age appropriate judgment and insight, normal affect and mood  Assessment and Plan: Mixed hyperlipidemia - Plan: rosuvastatin (CRESTOR) 10 MG tablet, Lipid panel  Pt not compliant w med, after wt loss unsure if he still needs. Counseled on exercise. Doing well w diet.  F/u in 6 mo for CPE or prn. The patient voiced understanding and agreement to the plan.  [place], DO 02/11/20  9:10 AM

## 2020-02-11 NOTE — Patient Instructions (Signed)
Give us 2-3 business days to get the results of your labs back.   Keep the diet clean and stay active.  Aim to do some physical exertion for 150 minutes per week. This is typically divided into 5 days per week, 30 minutes per day. The activity should be enough to get your heart rate up. Anything is better than nothing if you have time constraints.  Let us know if you need anything.  

## 2020-02-22 ENCOUNTER — Ambulatory Visit: Payer: 59 | Admitting: Family Medicine

## 2020-03-22 ENCOUNTER — Other Ambulatory Visit: Payer: 59

## 2020-06-23 ENCOUNTER — Other Ambulatory Visit: Payer: Self-pay

## 2020-06-23 ENCOUNTER — Encounter: Payer: Self-pay | Admitting: Family Medicine

## 2020-06-23 ENCOUNTER — Other Ambulatory Visit: Payer: Self-pay | Admitting: Family Medicine

## 2020-06-23 ENCOUNTER — Ambulatory Visit (INDEPENDENT_AMBULATORY_CARE_PROVIDER_SITE_OTHER): Payer: 59 | Admitting: Family Medicine

## 2020-06-23 VITALS — BP 122/78 | HR 85 | Temp 98.2°F | Ht 71.0 in | Wt 270.2 lb

## 2020-06-23 DIAGNOSIS — Z1159 Encounter for screening for other viral diseases: Secondary | ICD-10-CM | POA: Diagnosis not present

## 2020-06-23 DIAGNOSIS — Z0000 Encounter for general adult medical examination without abnormal findings: Secondary | ICD-10-CM | POA: Diagnosis not present

## 2020-06-23 DIAGNOSIS — E782 Mixed hyperlipidemia: Secondary | ICD-10-CM | POA: Diagnosis not present

## 2020-06-23 MED ORDER — FENOFIBRATE 48 MG PO TABS: 48.0000 mg | ORAL_TABLET | Freq: Every day | ORAL | 3 refills | Status: DC

## 2020-06-23 NOTE — Progress Notes (Signed)
Chief Complaint  Patient presents with  . Annual Exam    Well Male Jonathan Hutchinson is here for a complete physical. His last physical was >1 year ago.  Current diet: in general, a "healthy" diet.   Current exercise: lifting weights, cardio Weight trend: stable Fatigue out of ordinary? No. Seat belt? Yes.     Health maintenance Tetanus- Yes HIV- Yes Hep C- No  Past Medical History:  Diagnosis Date  . Mixed hyperlipidemia      Past Surgical History:  Procedure Laterality Date  . KNEE SURGERY Right   . testical removal     Removed Left    Medications  Current Outpatient Medications on File Prior to Visit  Medication Sig Dispense Refill  . rosuvastatin (CRESTOR) 10 MG tablet Take 1 tablet (10 mg total) by mouth daily. 30 tablet 10   Allergies No Known Allergies  Family History Family History  Problem Relation Age of Onset  . Hypertension Father   . Hyperlipidemia Father     Review of Systems: Constitutional: no fevers or chills Eye:  no recent significant change in vision Ear/Nose/Mouth/Throat:  Ears:  no hearing loss Nose/Mouth/Throat:  no complaints of nasal congestion, no sore throat Cardiovascular:  no chest pain Respiratory:  no shortness of breath Gastrointestinal:  no abdominal pain, no change in bowel habits GU:  Male: negative for dysuria Musculoskeletal/Extremities:  no pain of the joints Integumentary (Skin/Breast):  no abnormal skin lesions reported Neurologic:  no headaches Endocrine: No unexpected weight changes Hematologic/Lymphatic:  no night sweats  Exam BP 122/78 (BP Location: Left Arm, Patient Position: Sitting, Cuff Size: Large)   Pulse 85   Temp 98.2 F (36.8 C) (Oral)   Ht 5\' 11"  (1.803 m)   Wt 270 lb 4 oz (122.6 kg)   SpO2 97%   BMI 37.69 kg/m  General:  well developed, well nourished, in no apparent distress  Skin:  no significant moles, warts, or growths Head:  no masses, lesions, or tenderness Eyes:  pupils equal and round,  sclera anicteric without injection Ears:  canals without lesions, TMs shiny without retraction, no obvious effusion, no erythema Nose:  nares patent, septum midline, mucosa normal Throat/Pharynx:  lips and gingiva without lesion; tongue and uvula midline; non-inflamed pharynx; no exudates or postnasal drainage Neck: neck supple without adenopathy, thyromegaly, or masses Lungs:  clear to auscultation, breath sounds equal bilaterally, no respiratory distress Cardio:  regular rate and rhythm, no bruits, no LE edema Abdomen:  abdomen soft, nontender; bowel sounds normal; no masses or organomegaly Rectal: Deferred Musculoskeletal:  symmetrical muscle groups noted without atrophy or deformity Extremities:  no clubbing, cyanosis, or edema, no deformities, no skin discoloration Neuro:  gait normal; deep tendon reflexes normal and symmetric Psych: well oriented with normal range of affect and appropriate judgment/insight  Assessment and Plan  Well adult exam  Mixed hyperlipidemia - Plan: Lipid panel, Comprehensive metabolic panel  Encounter for hepatitis C screening test for low risk patient - Plan: Hepatitis C antibody   Well 34 y.o. male. Counseled on diet and exercise. Self testicular exams recommended at least monthly.  Other orders as above. Follow up in 6 months pending the above workup. The patient voiced understanding and agreement to the plan.  20 [place], DO 06/23/20 10:25 AM

## 2020-06-23 NOTE — Patient Instructions (Addendum)
Give us 2-3 business days to get the results of your labs back.   Keep the diet clean and stay active.  Do monthly self testicular checks in the shower. You are feeling for lumps/bumps that don't belong. If you feel anything like this, let me know!  I recommend getting the flu shot in mid October. This suggestion would change if the CDC comes out with a different recommendation.   Let us know if you need anything. 

## 2020-06-26 LAB — COMPREHENSIVE METABOLIC PANEL
AG Ratio: 2.1 (calc) (ref 1.0–2.5)
ALT: 29 U/L (ref 9–46)
AST: 20 U/L (ref 10–40)
Albumin: 4.6 g/dL (ref 3.6–5.1)
Alkaline phosphatase (APISO): 57 U/L (ref 36–130)
BUN: 10 mg/dL (ref 7–25)
CO2: 24 mmol/L (ref 20–32)
Calcium: 9.1 mg/dL (ref 8.6–10.3)
Chloride: 104 mmol/L (ref 98–110)
Creat: 0.87 mg/dL (ref 0.60–1.35)
Globulin: 2.2 g/dL (calc) (ref 1.9–3.7)
Glucose, Bld: 97 mg/dL (ref 65–99)
Potassium: 3.7 mmol/L (ref 3.5–5.3)
Sodium: 139 mmol/L (ref 135–146)
Total Bilirubin: 1.1 mg/dL (ref 0.2–1.2)
Total Protein: 6.8 g/dL (ref 6.1–8.1)

## 2020-06-26 LAB — LIPID PANEL
Cholesterol: 189 mg/dL (ref <200)
HDL: 30 mg/dL — ABNORMAL LOW (ref >=40)
Non-HDL Cholesterol (Calc): 159 mg/dL (calc) — ABNORMAL HIGH (ref <130)
Total CHOL/HDL Ratio: 6.3 (calc) — ABNORMAL HIGH (ref <5.0)
Triglycerides: 561 mg/dL — ABNORMAL HIGH (ref <150)

## 2020-06-26 LAB — HEPATITIS C ANTIBODY
Hepatitis C Ab: NONREACTIVE
SIGNAL TO CUT-OFF: 0.03 (ref <1.00)

## 2020-07-18 ENCOUNTER — Other Ambulatory Visit: Payer: Self-pay

## 2020-07-18 ENCOUNTER — Encounter: Payer: Self-pay | Admitting: Family Medicine

## 2020-07-18 ENCOUNTER — Ambulatory Visit: Payer: 59 | Admitting: Family Medicine

## 2020-07-18 ENCOUNTER — Telehealth: Payer: Self-pay

## 2020-07-18 VITALS — BP 138/82 | HR 99 | Temp 99.1°F | Ht 70.0 in | Wt 273.0 lb

## 2020-07-18 DIAGNOSIS — S46812A Strain of other muscles, fascia and tendons at shoulder and upper arm level, left arm, initial encounter: Secondary | ICD-10-CM | POA: Diagnosis not present

## 2020-07-18 MED ORDER — MELOXICAM 15 MG PO TABS: 15.0000 mg | ORAL_TABLET | Freq: Every day | ORAL | 0 refills | Status: DC

## 2020-07-18 MED ORDER — CYCLOBENZAPRINE HCL 10 MG PO TABS: 5.0000 mg | ORAL_TABLET | Freq: Three times a day (TID) | ORAL | 0 refills | Status: DC | PRN

## 2020-07-18 NOTE — Progress Notes (Signed)
Musculoskeletal Exam  Patient: Jonathan Hutchinson DOB: 06-Feb-1986  DOS: 07/18/2020  SUBJECTIVE:  Chief Complaint:   Chief Complaint  Patient presents with  . Arm Pain  . Neck Pain    Jonathan Hutchinson is a 34 y.o.  male for evaluation and treatment of upper shoulder/neck pain.   Onset:  4 days ago. No inj or change in activity.  Location:  Character:  aching and sharp  Progression of issue:  is unchanged Associated symptoms: No bruising, redness, swelling, decreased ROM Treatment: to date has been rest, OTC NSAIDS, home exercises and heat.   Neurovascular symptoms: no  Past Medical History:  Diagnosis Date  . Mixed hyperlipidemia     Objective: VITAL SIGNS: BP 138/82 (BP Location: Left Arm, Patient Position: Sitting, Cuff Size: Normal)   Pulse 99   Temp 99.1 F (37.3 C) (Oral)   Ht 5\' 10"  (1.778 m)   Wt 273 lb (123.8 kg)   SpO2 97%   BMI 39.17 kg/m  Constitutional: Well formed, well developed. No acute distress. Thorax & Lungs: No accessory muscle use Musculoskeletal: L shoulder.   Normal active range of motion: yes.   Normal passive range of motion: yes Tenderness to palpation: no Increased tissue density of the proximal left trapezius Deformity: no  Ecchymosis: no Tests positive: none Tests negative: Spurling's, Hawkins, Neer's, empty can, cross over, lift off, speed's Neurologic: Normal sensory function. No focal deficits noted. DTR's equal and symmetric in UE's. No clonus. Psychiatric: Normal mood. Age appropriate judgment and insight. Alert & oriented x 3.    Assessment:  Strain of left trapezius muscle, initial encounter - Plan: meloxicam (MOBIC) 15 MG tablet, cyclobenzaprine (FLEXERIL) 10 MG tablet  Plan: Stretches/exercises, heat, ice, Tylenol.  If no improvement over the next several weeks, he will let me know and we will set him up with a sports medicine team. F/u as originally scheduled. The patient voiced understanding and agreement to the  plan.   [place], DO 07/18/20  3:30 PM

## 2020-07-18 NOTE — Patient Instructions (Signed)
Ice/cold pack over area for 10-15 min twice daily.  Heat (pad or rice pillow in microwave) over affected area, 10-15 minutes twice daily.   OK to take Tylenol 1000 mg (2 extra strength tabs) or 975 mg (3 regular strength tabs) every 6 hours as needed.  Take Flexeril (cyclobenzaprine) 1-2 hours before planned bedtime. If it makes you drowsy, do not take during the day. You can try half a tab the following night.  Trapezius stretches/exercises Do exercises exactly as told by your health care provider and adjust them as directed. It is normal to feel mild stretching, pulling, tightness, or discomfort as you do these exercises, but you should stop right away if you feel sudden pain or your pain gets worse.  Stretching and range of motion exercises These exercises warm up your muscles and joints and improve the movement and flexibility of your shoulder. These exercises can also help to relieve pain, numbness, and tingling. If you are unable to do any of the following for any reason, do not further attempt to do it.   Exercise A: Flexion, standing    1. Stand and hold a broomstick, a cane, or a similar object. Place your hands a little more than shoulder-width apart on the object. Your left / right hand should be palm-up, and your other hand should be palm-down. 2. Push the stick to raise your left / right arm out to your side and then over your head. Use your other hand to help move the stick. Stop when you feel a stretch in your shoulder, or when you reach the angle that is recommended by your health care provider. ? Avoid shrugging your shoulder while you raise your arm. Keep your shoulder blade tucked down toward your spine. 3. Hold for 30 seconds. 4. Slowly return to the starting position. Repeat 2 times. Complete this exercise 3 times per week.  Exercise B: Abduction, supine    1. Lie on your back and hold a broomstick, a cane, or a similar object. Place your hands a little more than  shoulder-width apart on the object. Your left / right hand should be palm-up, and your other hand should be palm-down. 2. Push the stick to raise your left / right arm out to your side and then over your head. Use your other hand to help move the stick. Stop when you feel a stretch in your shoulder, or when you reach the angle that is recommended by your health care provider. ? Avoid shrugging your shoulder while you raise your arm. Keep your shoulder blade tucked down toward your spine. 3. Hold for 30 seconds. 4. Slowly return to the starting position. Repeat 2 times. Complete this exercise 3 times per week.  Exercise C: Flexion, active-assisted    1. Lie on your back. You may bend your knees for comfort. 2. Hold a broomstick, a cane, or a similar object. Place your hands about shoulder-width apart on the object. Your palms should face toward your feet. 3. Raise the stick and move your arms over your head and behind your head, toward the floor. Use your healthy arm to help your left / right arm move farther. Stop when you feel a gentle stretch in your shoulder, or when you reach the angle where your health care provider tells you to stop. 4. Hold for 30 seconds. 5. Slowly return to the starting position. Repeat 2 times. Complete this exercise 3 times per week.  Exercise D: External rotation and abduction  1. Stand in a door frame with one of your feet slightly in front of the other. This is called a staggered stance. 2. Choose one of the following positions as told by your health care provider: ? Place your hands and forearms on the door frame above your head. ? Place your hands and forearms on the door frame at the height of your head. ? Place your hands on the door frame at the height of your elbows. 3. Slowly move your weight onto your front foot until you feel a stretch across your chest and in the front of your shoulders. Keep your head and chest upright and keep your abdominal  muscles tight. 4. Hold for 30 seconds. 5. To release the stretch, shift your weight to your back foot. Repeat 2 times. Complete this stretch 3 times per week.  Strengthening exercises These exercises build strength and endurance in your shoulder. Endurance is the ability to use your muscles for a long time, even after your muscles get tired. Exercise E: Scapular depression and adduction  1. Sit on a stable chair. Support your arms in front of you with pillows, armrests, or a tabletop. Keep your elbows in line with the sides of your body. 2. Gently move your shoulder blades down toward your middle back. Relax the muscles on the tops of your shoulders and in the back of your neck. 3. Hold for 3 seconds. 4. Slowly release the tension and relax your muscles completely before doing this exercise again. Repeat for a total of 10 repetitions. 5. After you have practiced this exercise, try doing the exercise without the arm support. Then, try the exercise while standing instead of sitting. Repeat 2 times. Complete this exercise 3 times per week.  Exercise F: Shoulder abduction, isometric    1. Stand or sit about 4-6 inches (10-15 cm) from a wall with your left / right side facing the wall. 2. Bend your left / right elbow and gently press your elbow against the wall. 3. Increase the pressure slowly until you are pressing as hard as you can without shrugging your shoulder. 4. Hold for 3 seconds. 5. Slowly release the tension and relax your muscles completely. Repeat for a total of 10 repetitions. Repeat 2 times. Complete this exercise 3 times per week.  Exercise G: Shoulder flexion, isometric    1. Stand or sit about 4-6 inches (10-15 cm) away from a wall with your left / right side facing the wall. 2. Keep your left / right elbow straight and gently press the top of your fist against the wall. Increase the pressure slowly until you are pressing as hard as you can without shrugging your  shoulder. 3. Hold for 10-15 seconds. 4. Slowly release the tension and relax your muscles completely. Repeat for a total of 10 repetitions. Repeat 2 times. Complete this exercise 3 times per week.  Exercise H: Internal rotation    1. Sit in a stable chair without armrests, or stand. Secure an exercise band at your left / right side, at elbow height. 2. Place a soft object, such as a folded towel or a small pillow, under your left / right upper arm so your elbow is a few inches (about 8 cm) away from your side. 3. Hold the end of the exercise band so the band stretches. 4. Keeping your elbow pressed against the soft object under your arm, move your forearm across your body toward your abdomen. Keep your body steady so the movement  is only coming from your shoulder. 5. Hold for 3 seconds. 6. Slowly return to the starting position. Repeat for a total of 10 repetitions. Repeat 2 times. Complete this exercise 3 times per week.  Exercise I: External rotation    1. Sit in a stable chair without armrests, or stand. 2. Secure an exercise band at your left / right side, at elbow height. 3. Place a soft object, such as a folded towel or a small pillow, under your left / right upper arm so your elbow is a few inches (about 8 cm) away from your side. 4. Hold the end of the exercise band so the band stretches. 5. Keeping your elbow pressed against the soft object under your arm, move your forearm out, away from your abdomen. Keep your body steady so the movement is only coming from your shoulder. 6. Hold for 3 seconds. 7. Slowly return to the starting position. Repeat for a total of 10 repetitions. Repeat 2 times. Complete this exercise 3 times per week. Exercise J: Shoulder extension  1. Sit in a stable chair without armrests, or stand. Secure an exercise band to a stable object in front of you so the band is at shoulder height. 2. Hold one end of the exercise band in each hand. Your palms should  face each other. 3. Straighten your elbows and lift your hands up to shoulder height. 4. Step back, away from the secured end of the exercise band, until the band stretches. 5. Squeeze your shoulder blades together and pull your hands down to the sides of your thighs. Stop when your hands are straight down by your sides. Do not let your hands go behind your body. 6. Hold for 3 seconds. 7. Slowly return to the starting position. Repeat for a total of 10 repetitions. Repeat 2 times. Complete this exercise 3 times per week.  Exercise K: Shoulder extension, prone    1. Lie on your abdomen on a firm surface so your left / right arm hangs over the edge. 2. Hold a 5 lb weight in your hand so your palm faces in toward your body. Your arm should be straight. 3. Squeeze your shoulder blade down toward the middle of your back. 4. Slowly raise your arm behind you, up to the height of the surface that you are lying on. Keep your arm straight. 5. Hold for 3 seconds. 6. Slowly return to the starting position and relax your muscles. Repeat for a total of 10 repetitions. Repeat 2 times. Complete this exercise 3 times per week.   Exercise L: Horizontal abduction, prone  1. Lie on your abdomen on a firm surface so your left / right arm hangs over the edge. 2. Hold a 5 lb weight in your hand so your palm faces toward your feet. Your arm should be straight. 3. Squeeze your shoulder blade down toward the middle of your back. 4. Bend your elbow so your hand moves up, until your elbow is bent to an "L" shape (90 degrees). With your elbow bent, slowly move your forearm forward and up. Raise your hand up to the height of the surface that you are lying on. ? Your upper arm should not move, and your elbow should stay bent. ? At the top of the movement, your palm should face the floor. 5. Hold for 3 seconds. 6. Slowly return to the starting position and relax your muscles. Repeat for a total of 10  repetitions. Repeat 2 times. Complete this  exercise 3 times per week.  Exercise M: Horizontal abduction, standing  1. Sit on a stable chair, or stand. 2. Secure an exercise band to a stable object in front of you so the band is at shoulder height. 3. Hold one end of the exercise band in each hand. 4. Straighten your elbows and lift your hands straight in front of you, up to shoulder height. Your palms should face down, toward the floor. 5. Step back, away from the secured end of the exercise band, until the band stretches. 6. Move your arms out to your sides, and keep your arms straight. 7. Hold for 3 seconds. 8. Slowly return to the starting position. Repeat for a total of 10 repetitions. Repeat 2 times. Complete this exercise 3 times per week.  Exercise N: Scapular retraction and elevation  1. Sit on a stable chair, or stand. 2. Secure an exercise band to a stable object in front of you so the band is at shoulder height. 3. Hold one end of the exercise band in each hand. Your palms should face each other. 4. Sit in a stable chair without armrests, or stand. 5. Step back, away from the secured end of the exercise band, until the band stretches. 6. Squeeze your shoulder blades together and lift your hands over your head. Keep your elbows straight. 7. Hold for 3 seconds. 8. Slowly return to the starting position. Repeat for a total of 10 repetitions. Repeat 2 times. Complete this exercise 3 times per week.  This information is not intended to replace advice given to you by your health care provider. Make sure you discuss any questions you have with your health care provider. Document Released: 09/23/2005 Document Revised: 05/30/2016 Document Reviewed: 08/10/2015 Elsevier Interactive Patient Education  2017 [employer].

## 2020-07-18 NOTE — Telephone Encounter (Signed)
Nurse Assessment Nurse: Alexander Mt RN, Nicholaus Bloom Date/Time (Eastern Time): 07/18/2020 7:08:19 AM Confirm and document reason for call. If symptomatic, describe symptoms. ---Caller states he has had pain in his left triceps to his neck for 4 days and he is unable to sleep. Caller states he has taking over the counter pain medicine( bayer back and pain) and muscle relaxer has not helped much. No injury. No chest pain. Pain is 4-7 and is constant. Does the patient have any new or worsening symptoms? ---Yes Will a triage be completed? ---Yes Related visit to physician within the last 2 weeks? ---No Does the PT have any chronic conditions? (i.e. diabetes, asthma, this includes High risk factors for pregnancy, etc.) ---Yes List chronic conditions. ---HTN, cholesterol Is this a behavioral health or substance abuse call? ---No Guidelines Guideline Title Affirmed Question Affirmed Notes Nurse Date/Time Lamount Cohen Time) Shoulder Pain [1] MODERATE pain (e.g., interferes with normal activities) AND [2] present > 3 days Graylin Shiver 07/18/2020 7:11:27 AM Disp. Time Lamount Cohen Time) Disposition Final User 07/18/2020 7:13:44 AM SEE PCP WITHIN 3 DAYS Yes [place], RN, Nicholaus Bloom PLEASE NOTE: All timestamps contained within this report are represented as [place] Standard Time. CONFIDENTIALTY NOTICE: This fax transmission is intended only for the addressee. It contains information that is legally privileged, confidential or otherwise protected from use or disclosure. If you are not the intended recipient, you are strictly prohibited from reviewing, disclosing, copying using or disseminating any of this information or taking any action in reliance on or regarding this information. If you have received this fax in error, please notify us immediately by telephone so that we can arrange for its return to [place]. Phone: [phone], Toll-Free: [phone], Fax: [phone] Page: 2 of 2 Call Id: [id number] Caller  Disagree/Comply Comply Caller Understands Yes PreDisposition Did not know what to do Care Advice Given Per Guideline SEE PCP WITHIN 3 DAYS: * You need to be seen within 2 or 3 days. PAIN MEDICINES: * ACETAMINOPHEN - REGULAR STRENGTH TYLENOL: Take 650 mg (two 325 mg pills) by mouth every 4 to 6 hours as needed. Each Regular Strength Tylenol pill has 325 mg of acetaminophen. The most you should take each day is 3,250 mg (10 pills a day). * IBUPROFEN (E.G., MOTRIN, ADVIL): Take 400 mg (two 200 mg pills) by mouth every 6 hours. The most you should take each day is 1,200 mg (six 200 mg pills), unless your doctor has told you to take more. CALL BACK IF: * Severe pain occurs * Chest pain or breathing difficulty occurs * Signs of infection occur (e.g., spreading redness, warmth, fever) * You become worse CARE ADVICE given per Shoulder Pain (Adult) guideline   Appt w/ PCP today. 07/18/20.

## 2020-08-07 ENCOUNTER — Other Ambulatory Visit (INDEPENDENT_AMBULATORY_CARE_PROVIDER_SITE_OTHER): Payer: 59

## 2020-08-07 ENCOUNTER — Other Ambulatory Visit: Payer: Self-pay

## 2020-08-07 DIAGNOSIS — E782 Mixed hyperlipidemia: Secondary | ICD-10-CM | POA: Diagnosis not present

## 2020-08-08 ENCOUNTER — Other Ambulatory Visit: Payer: Self-pay | Admitting: Family Medicine

## 2020-08-08 LAB — HEPATIC FUNCTION PANEL
AG Ratio: 2.1 (calc) (ref 1.0–2.5)
ALT: 29 U/L (ref 9–46)
AST: 20 U/L (ref 10–40)
Albumin: 4.7 g/dL (ref 3.6–5.1)
Alkaline phosphatase (APISO): 68 U/L (ref 36–130)
Bilirubin, Direct: 0.1 mg/dL (ref 0.0–0.2)
Globulin: 2.2 g/dL (calc) (ref 1.9–3.7)
Indirect Bilirubin: 0.6 mg/dL (calc) (ref 0.2–1.2)
Total Bilirubin: 0.7 mg/dL (ref 0.2–1.2)
Total Protein: 6.9 g/dL (ref 6.1–8.1)

## 2020-08-08 LAB — LIPID PANEL
Cholesterol: 242 mg/dL — ABNORMAL HIGH (ref <200)
HDL: 31 mg/dL — ABNORMAL LOW (ref >=40)
Non-HDL Cholesterol (Calc): 211 mg/dL (calc) — ABNORMAL HIGH (ref <130)
Total CHOL/HDL Ratio: 7.8 (calc) — ABNORMAL HIGH (ref <5.0)
Triglycerides: 1076 mg/dL — ABNORMAL HIGH (ref <150)

## 2020-08-08 MED ORDER — FENOFIBRATE 145 MG PO TABS: 145.0000 mg | ORAL_TABLET | Freq: Every day | ORAL | 3 refills | Status: DC

## 2021-07-23 ENCOUNTER — Other Ambulatory Visit: Payer: Self-pay

## 2021-07-24 ENCOUNTER — Encounter: Payer: Self-pay | Admitting: Family Medicine

## 2021-07-24 ENCOUNTER — Ambulatory Visit (INDEPENDENT_AMBULATORY_CARE_PROVIDER_SITE_OTHER): Payer: 59 | Admitting: Family Medicine

## 2021-07-24 VITALS — BP 132/86 | HR 84 | Temp 98.8°F | Ht 70.0 in | Wt 287.2 lb

## 2021-07-24 DIAGNOSIS — Z0000 Encounter for general adult medical examination without abnormal findings: Secondary | ICD-10-CM | POA: Diagnosis not present

## 2021-07-24 DIAGNOSIS — R0683 Snoring: Secondary | ICD-10-CM | POA: Diagnosis not present

## 2021-07-24 LAB — COMPREHENSIVE METABOLIC PANEL
ALT: 60 U/L — ABNORMAL HIGH (ref 0–53)
AST: 34 U/L (ref 0–37)
Albumin: 4.8 g/dL (ref 3.5–5.2)
Alkaline Phosphatase: 64 U/L (ref 39–117)
BUN: 9 mg/dL (ref 6–23)
CO2: 27 mEq/L (ref 19–32)
Calcium: 9.5 mg/dL (ref 8.4–10.5)
Chloride: 103 mEq/L (ref 96–112)
Creatinine, Ser: 0.88 mg/dL (ref 0.40–1.50)
GFR: 111.2 mL/min (ref >60.00)
Glucose, Bld: 89 mg/dL (ref 70–99)
Potassium: 3.6 mEq/L (ref 3.5–5.1)
Sodium: 140 mEq/L (ref 135–145)
Total Bilirubin: 1 mg/dL (ref 0.2–1.2)
Total Protein: 7.1 g/dL (ref 6.0–8.3)

## 2021-07-24 LAB — CBC
HCT: 46.3 % (ref 39.0–52.0)
Hemoglobin: 16 g/dL (ref 13.0–17.0)
MCHC: 34.6 g/dL (ref 30.0–36.0)
MCV: 86.2 fl (ref 78.0–100.0)
Platelets: 233 10*3/uL (ref 150.0–400.0)
RBC: 5.38 Mil/uL (ref 4.22–5.81)
RDW: 14 % (ref 11.5–15.5)
WBC: 8.4 10*3/uL (ref 4.0–10.5)

## 2021-07-24 LAB — LIPID PANEL
Cholesterol: 208 mg/dL — ABNORMAL HIGH (ref 0–200)
HDL: 32.7 mg/dL — ABNORMAL LOW (ref >39.00)
Total CHOL/HDL Ratio: 6
Triglycerides: 459 mg/dL — ABNORMAL HIGH (ref 0.0–149.0)

## 2021-07-24 LAB — LDL CHOLESTEROL, DIRECT: Direct LDL: 96 mg/dL

## 2021-07-24 NOTE — Progress Notes (Signed)
Chief Complaint  Patient presents with   Annual Exam    Well Male Jonathan Hutchinson is here for a complete physical.   His last physical was >1 year ago.  Current diet: in general, diet has been better.   Current exercise: no routine exercising Weight trend: steadily increasing Fatigue out of ordinary? No. Seat belt? Yes.    Health maintenance Tetanus- Yes HIV- Yes Hep C- Yes  Past Medical History:  Diagnosis Date   Mixed hyperlipidemia      Past Surgical History:  Procedure Laterality Date   KNEE SURGERY Right    testical removal     Removed Left    Medications  Current Outpatient Medications on File Prior to Visit  Medication Sig Dispense Refill   fenofibrate (TRICOR) 145 MG tablet Take 1 tablet (145 mg total) by mouth daily. 30 tablet 3   rosuvastatin (CRESTOR) 10 MG tablet Take 1 tablet (10 mg total) by mouth daily. 30 tablet 10   Allergies No Known Allergies  Family History Family History  Problem Relation Age of Onset   Hypertension Father    Hyperlipidemia Father     Review of Systems: Constitutional: no fevers or chills Eye:  no recent significant change in vision Ear/Nose/Mouth/Throat:  Ears:  no hearing loss Nose/Mouth/Throat:  no complaints of nasal congestion, no sore throat Cardiovascular:  no chest pain Respiratory:  no shortness of breath Gastrointestinal:  no abdominal pain, no change in bowel habits GU:  Male: negative for dysuria Musculoskeletal/Extremities:  no new pain of the joints Integumentary (Skin/Breast):  no abnormal skin lesions reported Neurologic:  no headaches Endocrine: No unexpected weight changes Hematologic/Lymphatic:  no night sweats  Exam BP 132/86   Pulse 84   Temp 98.8 F (37.1 C) (Oral)   Ht 5\' 10"  (1.778 m)   Wt 287 lb 4 oz (130.3 kg)   SpO2 99%   BMI 41.22 kg/m  General:  well developed, well nourished, in no apparent distress Skin:  no significant moles, warts, or growths Head:  no masses, lesions, or  tenderness Eyes:  pupils equal and round, sclera anicteric without injection Ears:  canals without lesions, TMs shiny without retraction, no obvious effusion, no erythema Nose:  nares patent, septum midline, mucosa normal Throat/Pharynx:  lips and gingiva without lesion; tongue and uvula midline; non-inflamed pharynx; no exudates or postnasal drainage Neck: neck supple without adenopathy, thyromegaly, or masses Lungs:  clear to auscultation, breath sounds equal bilaterally, no respiratory distress Cardio:  regular rate and rhythm, no bruits, no LE edema Abdomen:  abdomen soft, nontender; bowel sounds normal; no masses or organomegaly Genital (male): Deferred Rectal: Deferred Musculoskeletal:  symmetrical muscle groups noted without atrophy or deformity Extremities:  no clubbing, cyanosis, or edema, no deformities, no skin discoloration Neuro:  gait normal; deep tendon reflexes normal and symmetric Psych: well oriented with normal range of affect and appropriate judgment/insight  Assessment and Plan  Well adult exam - Plan: CBC, Comprehensive metabolic panel, Lipid panel  Snoring - Plan: Ambulatory referral to Pulmonology  Morbid obesity (HCC) - Plan: Amb Ref to Medical Weight Management   Well 35 y.o. male. Counseled on diet and exercise. Self testicular exams recommended at least monthly.  Covid bivalent booster rec'd.  Refer to pulm for OSA eval. Refer [facility] for obesity management.  Counseling info provided in AVS as he is interested in this. He is a 31 and aspects of his job weigh on him.  Flu shot rec'd, declined politely today.  Other orders as above. Follow up in 6 mo pending the above workup. The patient voiced understanding and agreement to the plan.  Jilda Roche [place], DO 07/24/21 1:11 PM

## 2021-07-24 NOTE — Patient Instructions (Addendum)
Give [place] 2-3 business days to get the results of your labs back.   Keep the diet clean and stay active.  I recommend getting the updated bivalent covid vaccination booster at your convenience.   Do monthly self testicular checks in the shower. You are feeling for lumps/bumps that don't belong. If you feel anything like this, let me know!  Please consider counseling. Contact [phone] to schedule an appointment or inquire about cost/insurance coverage.  If you do not hear anything about your referrals in the next 1-2 weeks, call our office and ask for an update.  Let us know if you need anything.  Healthy Eating Plan Many factors influence your heart health, including eating and exercise habits. Heart (coronary) risk increases with abnormal blood fat (lipid) levels. Heart-healthy meal planning includes limiting unhealthy fats, increasing healthy fats, and making other small dietary changes. This includes maintaining a healthy body weight to help keep lipid levels within a normal range.  WHAT IS MY PLAN?  Your health care provider recommends that you: Drink a glass of water before meals to help with satiety. Eat slowly. An alternative to the water is to add Metamucil. This will help with satiety as well. It does contain calories, unlike water.  WHAT TYPES OF FAT SHOULD I CHOOSE? Choose healthy fats more often. Choose monounsaturated and polyunsaturated fats, such as olive oil and canola oil, flaxseeds, walnuts, almonds, and seeds. Eat more omega-3 fats. Good choices include salmon, mackerel, sardines, tuna, flaxseed oil, and ground flaxseeds. Aim to eat fish at least two times each week. Avoid foods with partially hydrogenated oils in them. These contain trans fats. Examples of foods that contain trans fats are stick margarine, some tub margarines, cookies, crackers, and other baked goods. If you are going to avoid a fat, this is the one to avoid!  WHAT GENERAL GUIDELINES DO I NEED TO  FOLLOW? Check food labels carefully to identify foods with trans fats. Avoid these types of options when possible. Fill one half of your plate with vegetables and green salads. Eat 4-5 servings of vegetables per day. A serving of vegetables equals 1 cup of raw leafy vegetables,  cup of raw or cooked cut-up vegetables, or  cup of vegetable juice. Fill one fourth of your plate with whole grains. Look for the word "whole" as the first word in the ingredient list. Fill one fourth of your plate with lean protein foods. Eat 4-5 servings of fruit per day. A serving of fruit equals one medium whole fruit,  cup of dried fruit,  cup of fresh, frozen, or canned fruit. Try to avoid fruits in cups/syrups as the sugar content can be high. Eat more foods that contain soluble fiber. Examples of foods that contain this type of fiber are apples, broccoli, carrots, beans, peas, and barley. Aim to get 20-30 g of fiber per day. Eat more home-cooked food and less restaurant, buffet, and fast food. Limit or avoid alcohol. Limit foods that are high in starch and sugar. Avoid fried foods when able. Cook foods by using methods other than frying. Baking, boiling, grilling, and broiling are all great options. Other fat-reducing suggestions include: Removing the skin from poultry. Removing all visible fats from meats. Skimming the fat off of stews, soups, and gravies before serving them. Steaming vegetables in water or broth. Lose weight if you are overweight. Losing just 5-10% of your initial body weight can help your overall health and prevent diseases such as diabetes and heart disease. Increase your  consumption of nuts, legumes, and seeds to 4-5 servings per week. One serving of dried beans or legumes equals  cup after being cooked, one serving of nuts equals 1 ounces, and one serving of seeds equals  ounce or 1 tablespoon.  WHAT ARE GOOD FOODS CAN I EAT? Grains Grainy breads (try to find bread that is 3 g of  fiber per slice or greater), oatmeal, light popcorn. Whole-grain cereals. Rice and pasta, including brown rice and those that are made with whole wheat. Edamame pasta is a great alternative to grain pasta. It has a higher protein content. Try to avoid significant consumption of white bread, sugary cereals, or pastries/baked goods.  Vegetables All vegetables. Cooked white potatoes do not count as vegetables.  Fruits All fruits, but limit pineapple and bananas as these fruits have a higher sugar content.  Meats and Other Protein Sources Lean, well-trimmed beef, veal, pork, and lamb. Chicken and [place] without skin. All fish and shellfish. Wild duck, rabbit, pheasant, and venison. Egg whites or low-cholesterol egg substitutes. Dried beans, peas, lentils, and tofu. Seeds and most nuts.  Dairy Low-fat or nonfat cheeses, including ricotta, string, and mozzarella. Skim or 1% milk that is liquid, powdered, or evaporated. Buttermilk that is made with low-fat milk. Nonfat or low-fat yogurt. Soy/Almond milk are good alternatives if you cannot handle dairy.  Beverages Water is the best for you. Sports drinks with less sugar are more desirable unless you are a highly active athlete.  Sweets and Desserts Sherbets and fruit ices. Honey, jam, marmalade, jelly, and syrups. Dark chocolate.  Eat all sweets and desserts in moderation.  Fats and Oils Nonhydrogenated (trans-free) margarines. Vegetable oils, including soybean, sesame, sunflower, olive, peanut, safflower, corn, canola, and cottonseed. Salad dressings or mayonnaise that are made with a vegetable oil. Limit added fats and oils that you use for cooking, baking, salads, and as spreads.  Other Cocoa powder. Coffee and tea. Most condiments.  The items listed above may not be a complete list of recommended foods or beverages. Contact your dietitian for more options.

## 2021-07-25 ENCOUNTER — Other Ambulatory Visit: Payer: Self-pay | Admitting: Family Medicine

## 2021-07-25 DIAGNOSIS — E782 Mixed hyperlipidemia: Secondary | ICD-10-CM

## 2021-07-25 MED ORDER — FENOFIBRATE 145 MG PO TABS
145.0000 mg | ORAL_TABLET | Freq: Every day | ORAL | 3 refills | Status: DC
Start: 2021-07-25 — End: 2022-02-19

## 2021-07-25 MED ORDER — ROSUVASTATIN CALCIUM 10 MG PO TABS: 10.0000 mg | ORAL_TABLET | Freq: Every day | ORAL | 3 refills | Status: DC

## 2021-08-29 ENCOUNTER — Other Ambulatory Visit: Payer: Self-pay

## 2021-08-29 ENCOUNTER — Ambulatory Visit: Payer: 59 | Admitting: Pulmonary Disease

## 2021-08-29 ENCOUNTER — Encounter: Payer: Self-pay | Admitting: Pulmonary Disease

## 2021-08-29 VITALS — BP 120/80 | HR 104 | Temp 97.7°F | Ht 70.0 in | Wt 286.0 lb

## 2021-08-29 DIAGNOSIS — G4733 Obstructive sleep apnea (adult) (pediatric): Secondary | ICD-10-CM

## 2021-08-29 NOTE — Patient Instructions (Signed)
Moderate probability of significant obstructive sleep apnea  We will schedule you for home sleep study Treatment options as we discussed  Tentative follow-up in 3 to 4 months  Sleep Apnea Sleep apnea affects breathing during sleep. It causes breathing to stop for 10 seconds or more, or to become shallow. People with sleep apnea usually snore loudly. It can also increase the risk of: Heart attack. Stroke. Being very overweight (obese). Diabetes. Heart failure. Irregular heartbeat. High blood pressure. The goal of treatment is to help you breathe normally again. What are the causes? The most common cause of this condition is a collapsed or blocked airway. There are three kinds of sleep apnea: Obstructive sleep apnea. This is caused by a blocked or collapsed airway. Central sleep apnea. This happens when the brain does not send the right signals to the muscles that control breathing. Mixed sleep apnea. This is a combination of obstructive and central sleep apnea. What increases the risk? Being overweight. Smoking. Having a small airway. Being older. Being male. Drinking alcohol. Taking medicines to calm yourself (sedatives or tranquilizers). Having family members with the condition. Having a tongue or tonsils that are larger than normal. What are the signs or symptoms? Trouble staying asleep. Loud snoring. Headaches in the morning. Waking up gasping. Dry mouth or sore throat in the morning. Being sleepy or tired during the day. If you are sleepy or tired during the day, you may also: Not be able to focus your mind (concentrate). Forget things. Get angry a lot and have mood swings. Feel sad (depressed). Have changes in your personality. Have less interest in sex, if you are male. Be unable to have an erection, if you are male. How is this treated?  Sleeping on your side. Using a medicine to get rid of mucus in your nose (decongestant). Avoiding the use of alcohol,  medicines to help you relax, or certain pain medicines (narcotics). Losing weight, if needed. Changing your diet. Quitting smoking. Using a machine to open your airway while you sleep, such as: An oral appliance. This is a mouthpiece that shifts your lower jaw forward. A CPAP device. This device blows air through a mask when you breathe out (exhale). An EPAP device. This has valves that you put in each nostril. A BIPAP device. This device blows air through a mask when you breathe in (inhale) and breathe out. Having surgery if other treatments do not work. Follow these instructions at home: Lifestyle Make changes that your doctor recommends. Eat a healthy diet. Lose weight if needed. Avoid alcohol, medicines to help you relax, and some pain medicines. Do not smoke or use any products that contain nicotine or tobacco. If you need help quitting, ask your doctor. General instructions Take over-the-counter and prescription medicines only as told by your doctor. If you were given a machine to use while you sleep, use it only as told by your doctor. If you are having surgery, make sure to tell your doctor you have sleep apnea. You may need to bring your device with you. Keep all follow-up visits. Contact a doctor if: The machine that you were given to use during sleep bothers you or does not seem to be working. You do not get better. You get worse. Get help right away if: Your chest hurts. You have trouble breathing in enough air. You have an uncomfortable feeling in your back, arms, or stomach. You have trouble talking. One side of your body feels weak. A part of your face  is hanging down. These symptoms may be an emergency. Get help right away. Call your local emergency services (911 in the [place] Do not wait to see if the symptoms will go away. Do not drive yourself to the hospital. Summary This condition affects breathing during sleep. The most common cause is a collapsed or  blocked airway. The goal of treatment is to help you breathe normally while you sleep. This information is not intended to replace advice given to you by your health care provider. Make sure you discuss any questions you have with your health care provider. Document Revised: 05/02/2021 Document Reviewed: 09/01/2020 Elsevier Patient Education  2022 [employer].

## 2021-08-29 NOTE — Progress Notes (Signed)
Jonathan Hutchinson    [id number]    09-17-1986  Primary Care Physician:Wendling, Jilda Roche, DO  Referring Physician: Sharlene Dory, DO [address] [place],  [place] [address]  Chief complaint:   History of snoring, witnessed apneas  HPI:  Patient with snoring, witnessed apneas Usually goes to bed between 11 and 12 Falls asleep in 5 to 10 minutes 2-3 awakenings Final wake up time 7 30-8  Weight has been stable up and down about 10 to 15 pounds Admits to dryness of his mouth in the mornings Occasional night sweats No morning headaches  Dad does have obstructive sleep apnea on CPAP  No choking respirations at night Memory is fine  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   rosuvastatin (CRESTOR) 10 MG tablet Take 1 tablet (10 mg total) by mouth daily.   No facility-administered encounter medications on file as of 08/29/2021.    Allergies as of 08/29/2021   (No Known Allergies)    Past Medical History:  Diagnosis Date   Mixed hyperlipidemia     Past Surgical History:  Procedure Laterality Date   KNEE SURGERY Right    testical removal     Removed Left    Family History  Problem Relation Age of Onset   Hypertension Father    Hyperlipidemia Father     Social History   Socioeconomic History   Marital status: Significant Other    Spouse name: Not on file   Number of children: Not on file   Years of education: Not on file   Highest education level: Not on file  Occupational History   Not on file  Tobacco Use   Smoking status: Former   Smokeless tobacco: Never  Substance and Sexual Activity   Alcohol use: Yes   Drug use: No   Sexual activity: Not on file  Other Topics Concern   Not on file  Social History Narrative   Not on file   Social Determinants of  Health   Financial Resource Strain: Not on file  Food Insecurity: Not on file  Transportation Needs: Not on file  Physical Activity: Not on file  Stress: Not on file  Social Connections: Not on file  Intimate Partner Violence: Not on file    Review of Systems  Constitutional: Negative.   Psychiatric/Behavioral:  Positive for sleep disturbance.    Vitals:   08/29/21 0926  BP: 120/80  Pulse: (!) 104  Temp: 97.7 F (36.5 C)  SpO2: 92%     Physical Exam Constitutional:      Appearance: He is obese.  HENT:     Head: Normocephalic.     Mouth/Throat:     Mouth: Mucous membranes are moist.     Comments: Crowded oropharynx, Mallampati 4, macroglossia Eyes:     Pupils: Pupils are equal, round, and reactive to light.  Cardiovascular:     Rate and Rhythm: Normal rate and regular rhythm.     Heart sounds: No murmur heard.   No friction rub.  Pulmonary:     Effort: No respiratory distress.     Breath sounds: No stridor. No wheezing or rhonchi.  Musculoskeletal:     Cervical back: No rigidity.  Neurological:     Mental Status: He is alert.  Psychiatric:  Mood and Affect: Mood normal.   Results of the Epworth flowsheet 08/29/2021  Sitting and reading 2  Watching TV 3  Sitting, inactive in a public place (e.g. a theatre or a meeting) 2  As a passenger in a car for an hour without a break 1  Lying down to rest in the afternoon when circumstances permit 3  Sitting and talking to someone 0  Sitting quietly after a lunch without alcohol 0  In a car, while stopped for a few minutes in traffic 0  Total score 11     Data Reviewed: No previous sleep apnea testing  Assessment:  Excessive daytime sleepiness  Snoring, witnessed apnea Moderate probability of significant obstructive sleep apnea  Class III obesity  Pathophysiology of sleep disordered breathing reviewed with the patient Treatment options discussed with the patient  Plan/Recommendations: Schedule  patient for home sleep study  Weight loss efforts encouraged  Tentative follow-up in 3 to 4 months  Encouraged to call with any significant concerns     Virl Diamond MD [facility] 08/29/2021, 9:45 AM  CC: Sharlene Dory*

## 2021-09-05 ENCOUNTER — Other Ambulatory Visit (INDEPENDENT_AMBULATORY_CARE_PROVIDER_SITE_OTHER): Payer: 59

## 2021-09-05 ENCOUNTER — Other Ambulatory Visit: Payer: Self-pay

## 2021-09-05 DIAGNOSIS — E782 Mixed hyperlipidemia: Secondary | ICD-10-CM | POA: Diagnosis not present

## 2021-09-05 LAB — LIPID PANEL
Cholesterol: 228 mg/dL — ABNORMAL HIGH (ref 0–200)
HDL: 30.3 mg/dL — ABNORMAL LOW (ref >39.00)
Total CHOL/HDL Ratio: 8
Triglycerides: 987 mg/dL — ABNORMAL HIGH (ref 0.0–149.0)

## 2021-09-05 LAB — LDL CHOLESTEROL, DIRECT: Direct LDL: 84 mg/dL

## 2021-09-05 LAB — HEPATIC FUNCTION PANEL
ALT: 63 U/L — ABNORMAL HIGH (ref 0–53)
AST: 32 U/L (ref 0–37)
Albumin: 4.5 g/dL (ref 3.5–5.2)
Alkaline Phosphatase: 65 U/L (ref 39–117)
Bilirubin, Direct: 0.1 mg/dL (ref 0.0–0.3)
Total Bilirubin: 0.8 mg/dL (ref 0.2–1.2)
Total Protein: 6.6 g/dL (ref 6.0–8.3)

## 2021-09-26 DIAGNOSIS — R6889 Other general symptoms and signs: Secondary | ICD-10-CM | POA: Insufficient documentation

## 2021-11-26 ENCOUNTER — Other Ambulatory Visit: Payer: Self-pay

## 2021-11-26 ENCOUNTER — Ambulatory Visit (INDEPENDENT_AMBULATORY_CARE_PROVIDER_SITE_OTHER): Payer: 59

## 2021-11-26 DIAGNOSIS — G4733 Obstructive sleep apnea (adult) (pediatric): Secondary | ICD-10-CM

## 2021-12-19 ENCOUNTER — Other Ambulatory Visit: Payer: Self-pay

## 2021-12-19 ENCOUNTER — Telehealth: Payer: Self-pay | Admitting: Pulmonary Disease

## 2021-12-19 DIAGNOSIS — G4733 Obstructive sleep apnea (adult) (pediatric): Secondary | ICD-10-CM

## 2021-12-19 NOTE — Telephone Encounter (Signed)
Call patient ? ?Sleep study result ? ?Date of study: ?11/26/2021 ? ?Impression: ?Severe obstructive sleep apnea ?Moderately severe oxygen desaturations ? ?Recommendation: ?DME referral ? ?Recommend CPAP therapy for severe obstructive sleep apnea ? ?Auto titrating CPAP with pressure settings of 5-20 will be appropriate ? ?Encourage weight loss measures ? ?Follow-up in the office 4 to 6 weeks following initiation of treatment ? ?

## 2021-12-19 NOTE — Telephone Encounter (Signed)
Called and spoke with patient about sleep study results. Patient agreed to a cpap machine. New order was placed for cpap. Told patient that after he got his machine to call office and schedule a follow up vist 4-6 weeks out. Nothing further  ?

## 2022-01-10 DIAGNOSIS — Z0289 Encounter for other administrative examinations: Secondary | ICD-10-CM

## 2022-01-23 ENCOUNTER — Ambulatory Visit: Payer: 59 | Admitting: Family Medicine

## 2022-01-23 ENCOUNTER — Encounter (INDEPENDENT_AMBULATORY_CARE_PROVIDER_SITE_OTHER): Payer: Self-pay

## 2022-01-23 ENCOUNTER — Ambulatory Visit (INDEPENDENT_AMBULATORY_CARE_PROVIDER_SITE_OTHER): Payer: 59 | Admitting: Bariatrics

## 2022-01-30 ENCOUNTER — Ambulatory Visit (INDEPENDENT_AMBULATORY_CARE_PROVIDER_SITE_OTHER): Payer: 59 | Admitting: Bariatrics

## 2022-01-30 ENCOUNTER — Encounter (INDEPENDENT_AMBULATORY_CARE_PROVIDER_SITE_OTHER): Payer: Self-pay | Admitting: Bariatrics

## 2022-01-30 VITALS — BP 138/90 | HR 82 | Temp 97.2°F | Ht 71.0 in | Wt 291.0 lb

## 2022-01-30 DIAGNOSIS — R5383 Other fatigue: Secondary | ICD-10-CM

## 2022-01-30 DIAGNOSIS — G4733 Obstructive sleep apnea (adult) (pediatric): Secondary | ICD-10-CM

## 2022-01-30 DIAGNOSIS — R7309 Other abnormal glucose: Secondary | ICD-10-CM

## 2022-01-30 DIAGNOSIS — Z6841 Body Mass Index (BMI) 40.0 and over, adult: Secondary | ICD-10-CM

## 2022-01-30 DIAGNOSIS — E559 Vitamin D deficiency, unspecified: Secondary | ICD-10-CM

## 2022-01-30 DIAGNOSIS — E668 Other obesity: Secondary | ICD-10-CM

## 2022-01-30 DIAGNOSIS — Z1331 Encounter for screening for depression: Secondary | ICD-10-CM

## 2022-01-30 DIAGNOSIS — E781 Pure hyperglyceridemia: Secondary | ICD-10-CM

## 2022-01-30 DIAGNOSIS — E782 Mixed hyperlipidemia: Secondary | ICD-10-CM | POA: Diagnosis not present

## 2022-01-30 DIAGNOSIS — R0602 Shortness of breath: Secondary | ICD-10-CM | POA: Diagnosis not present

## 2022-01-31 ENCOUNTER — Encounter (INDEPENDENT_AMBULATORY_CARE_PROVIDER_SITE_OTHER): Payer: Self-pay | Admitting: Bariatrics

## 2022-01-31 DIAGNOSIS — R748 Abnormal levels of other serum enzymes: Secondary | ICD-10-CM | POA: Insufficient documentation

## 2022-01-31 DIAGNOSIS — E785 Hyperlipidemia, unspecified: Secondary | ICD-10-CM | POA: Insufficient documentation

## 2022-01-31 LAB — COMPREHENSIVE METABOLIC PANEL
ALT: 70 IU/L — ABNORMAL HIGH (ref 0–44)
AST: 49 IU/L — ABNORMAL HIGH (ref 0–40)
Albumin/Globulin Ratio: 2.2 (ref 1.2–2.2)
Albumin: 4.7 g/dL (ref 4.0–5.0)
Alkaline Phosphatase: 75 IU/L (ref 44–121)
BUN/Creatinine Ratio: 10 (ref 9–20)
BUN: 9 mg/dL (ref 6–20)
Bilirubin Total: 1.3 mg/dL — ABNORMAL HIGH (ref 0.0–1.2)
CO2: 23 mmol/L (ref 20–29)
Calcium: 9.1 mg/dL (ref 8.7–10.2)
Chloride: 102 mmol/L (ref 96–106)
Creatinine, Ser: 0.88 mg/dL (ref 0.76–1.27)
Globulin, Total: 2.1 g/dL (ref 1.5–4.5)
Glucose: 85 mg/dL (ref 70–99)
Potassium: 3.7 mmol/L (ref 3.5–5.2)
Sodium: 142 mmol/L (ref 134–144)
Total Protein: 6.8 g/dL (ref 6.0–8.5)
eGFR: 114 mL/min/{1.73_m2} (ref >59)

## 2022-01-31 LAB — TSH+T4F+T3FREE
Free T4: 1.55 ng/dL (ref 0.82–1.77)
T3, Free: 3.8 pg/mL (ref 2.0–4.4)
TSH: 2.02 u[IU]/mL (ref 0.450–4.500)

## 2022-01-31 LAB — HEMOGLOBIN A1C
Est. average glucose Bld gHb Est-mCnc: 100 mg/dL
Hgb A1c MFr Bld: 5.1 % (ref 4.8–5.6)

## 2022-01-31 LAB — LIPID PANEL WITH LDL/HDL RATIO
Cholesterol, Total: 200 mg/dL — ABNORMAL HIGH (ref 100–199)
HDL: 30 mg/dL — ABNORMAL LOW (ref >39)
LDL Chol Calc (NIH): 120 mg/dL — ABNORMAL HIGH (ref 0–99)
LDL/HDL Ratio: 4 ratio — ABNORMAL HIGH (ref 0.0–3.6)
Triglycerides: 286 mg/dL — ABNORMAL HIGH (ref 0–149)
VLDL Cholesterol Cal: 50 mg/dL — ABNORMAL HIGH (ref 5–40)

## 2022-01-31 LAB — VITAMIN D 25 HYDROXY (VIT D DEFICIENCY, FRACTURES): Vit D, 25-Hydroxy: 16.8 ng/mL — ABNORMAL LOW (ref 30.0–100.0)

## 2022-01-31 LAB — INSULIN, RANDOM: INSULIN: 32.8 u[IU]/mL — ABNORMAL HIGH (ref 2.6–24.9)

## 2022-02-06 ENCOUNTER — Ambulatory Visit (INDEPENDENT_AMBULATORY_CARE_PROVIDER_SITE_OTHER): Payer: Self-pay | Admitting: Bariatrics

## 2022-02-06 NOTE — Progress Notes (Signed)
? ? ? ?Chief Complaint:  ? ?OBESITY ?Jonathan Hutchinson (MR# [id number]) is a 36 y.o. male who presents for evaluation and treatment of obesity and related comorbidities. Current BMI is Body mass index is 40.59 kg/m?Marland Kitchen Jonathan Hutchinson has been struggling with his weight for many years and has been unsuccessful in either losing weight, maintaining weight loss, or reaching his healthy weight goal. ? ?Jonathan Hutchinson enjoys cooking. He craves pizza and ice cream. He skips breakfast and lunch.  ? ?Jonathan Hutchinson is currently in the action stage of change and ready to dedicate time achieving and maintaining a healthier weight. Jonathan Hutchinson is interested in becoming our patient and working on intensive lifestyle modifications including (but not limited to) diet and exercise for weight loss. ? ?Jonathan Hutchinson's habits were reviewed today and are as follows: His family eats meals together, he thinks his family will eat healthier with him, his desired weight loss is 91 pounds, he has been heavy most of his life, he started gaining weight always around 250-290, his heaviest weight ever was 295 pounds, he has significant food cravings issues, he snacks frequently in the evenings, he wakes up frequently in the middle of the night to eat, he skips meals frequently, he is frequently drinking liquids with calories, he frequently eats larger portions than normal, and he struggles with emotional eating. ? ?Depression Screen ?Jonathan Hutchinson's Food and Mood (modified PHQ-9) score was 8. ? ? ?  01/30/2022  ?  8:20 AM  ?Depression screen PHQ 2/9  ?Decreased Interest 1  ?Down, Depressed, Hopeless 1  ?PHQ - 2 Score 2  ?Altered sleeping 3  ?Tired, decreased energy 2  ?Change in appetite 1  ?Feeling bad or failure about yourself  0  ?Trouble concentrating 0  ?Moving slowly or fidgety/restless 0  ?Suicidal thoughts 0  ?PHQ-9 Score 8  ?Difficult doing work/chores Not difficult at all  ? ?Subjective:  ? ?1. Other fatigue ?Jonathan Hutchinson denies daytime somnolence and denies waking up still tired. Patient has  a history of symptoms of daytime fatigue. Jonathan Hutchinson generally gets  6-8  hours of sleep per night, and states that he has generally restful sleep. Snoring is present. Apneic episodes is present. Epworth Sleepiness Score is 15. Oji will continue activities.  ? ?2. SOB (shortness of breath) on exertion ?Virgle notes increasing shortness of breath with exercising and seems to be worsening over time with weight gain. He notes getting out of breath sooner with activity than he used to. This has not gotten worse recently. Steban denies shortness of breath at rest or orthopnea.  ? ?3. Mixed hyperlipidemia ?Jonathan Hutchinson is currently taking Tricor and Crestor.  ? ?4. Hypertriglyceridemia ?Jonathan Hutchinson states that his last lipid panel was taken at a time when he was taking Prednisone . His triglycerides have been up to 987.0 in the past. He is currently on Tricor and Crestor but has not been taking the medication consistently.  ? ?5. OSA (obstructive sleep apnea) ?Jonathan Hutchinson uses a CPAP at night.  ? ?6. Elevated glucose ?Jonathan Hutchinson has a history of diabetes.  ? ?7. Vitamin D deficiency ?Jonathan Hutchinson is not on Vitamin D currently.  ? ?Assessment/Plan:  ? ?1. Other fatigue ?Jonathan Hutchinson does feel that his weight is causing his energy to be lower than it should be. Fatigue may be related to obesity, depression or many other causes. Labs will be ordered, and in the meanwhile, Jonathan Hutchinson will focus on self care including making healthy food choices, increasing physical activity and focusing on stress reduction. Jonathan Hutchinson will gradually increase exercise and  activities. We will review EKG today and we will check CMP and TSH today.   ?- EKG 12-Lead ?- Comprehensive metabolic panel ?- [id number] ? ?2. SOB (shortness of breath) on exertion ?Jonathan Hutchinson does feel that he gets out of breath more easily that he used to when he exercises. Jonathan Hutchinson's shortness of breath appears to be obesity related and exercise induced. He has agreed to work on weight loss and gradually increase  exercise to treat his exercise induced shortness of breath. Will continue to monitor closely.  ?- Comprehensive metabolic panel ?- [id number] ? ?3. Mixed hyperlipidemia ?Cardiovascular risk and specific lipid/LDL goals reviewed.  We will check Lipids and CMP today. We discussed several lifestyle modifications today and Jonathan Hutchinson will continue to work on diet, exercise and weight loss efforts. Orders and follow up as documented in patient record.  ? ?Counseling ?Intensive lifestyle modifications are the first line treatment for this issue. ?Dietary changes: Increase soluble fiber. Decrease simple carbohydrates. ?Exercise changes: Moderate to vigorous-intensity aerobic activity 150 minutes per week if tolerated. ?Lipid-lowering medications: see documented in medical record. ? ?- Comprehensive metabolic panel ?- Lipid Panel With LDL/HDL Ratio ? ?4. Hypertriglyceridemia ?Cardiovascular risk and specific lipid/LDL goals reviewed.  We will check Lipids and CMP today. We discussed several lifestyle modifications today and Jonathan Hutchinson will continue to work on diet, exercise and weight loss efforts. Orders and follow up as documented in patient record.  ? ?Counseling ?Intensive lifestyle modifications are the first line treatment for this issue. ?Dietary changes: Increase soluble fiber. Decrease simple carbohydrates. ?Exercise changes: Moderate to vigorous-intensity aerobic activity 150 minutes per week if tolerated. ?Lipid-lowering medications: see documented in medical record. ? ?- Comprehensive metabolic panel ?- Lipid Panel With LDL/HDL Ratio ? ?5. OSA (obstructive sleep apnea) ?Intensive lifestyle modifications are the first line treatment for this issue. Jonathan Hutchinson will continue to use his CPAP as directed. We discussed several lifestyle modifications today and he will continue to work on diet, exercise and weight loss efforts. We will continue to monitor. Orders and follow up as documented in patient record.   ? ?6. Elevated  glucose ?We will check insulin and A1C today.  ? ?- Hemoglobin A1c ?- Insulin, random ? ?7. Vitamin D deficiency ?Low Vitamin D level contributes to fatigue and are associated with obesity, breast, and colon cancer. We will check Vitamin D today and Jonathan Hutchinson will follow-up for routine testing of Vitamin D, at least 2-3 times per year to avoid over-replacement. ? ?- VITAMIN D 25 Hydroxy (Vit-D Deficiency, Fractures) ? ?8. Depression screening ?Jonathan Hutchinson had a positive depression screening. Depression is commonly associated with obesity and often results in emotional eating behaviors. We will monitor this closely and work on CBT to help improve the non-hunger eating patterns. Referral to Psychology may be required if no improvement is seen as he continues in our clinic.  ? ?9. Class 3 severe obesity due to excess calories with serious comorbidity and body mass index (BMI) of 40.0 to 44.9 in adult [facility]) ?Jonathan Hutchinson is currently in the action stage of change and his goal is to continue with weight loss efforts. I recommend Jonathan Hutchinson begin the structured treatment plan as follows: ? ?He has agreed to the Category 4 Plan. ? ?Jonathan Hutchinson will continue meal planning and she will continue intentional eating. We will review labs from 09/05/2022 BMP and  Lipids. 07/22/2021  CBC . ? ?Exercise goals: No exercise has been prescribed at this time.  ? ?Behavioral modification strategies: increasing lean protein intake, decreasing simple carbohydrates,  increasing vegetables, increasing water intake, decreasing eating out, no skipping meals, meal planning and cooking strategies, keeping healthy foods in the home, and planning for success. ? ?He was informed of the importance of frequent follow-up visits to maximize his success with intensive lifestyle modifications for his multiple health conditions. He was informed we would discuss his lab results at his next visit unless there is a critical issue that needs to be addressed sooner. Apostolos agreed to  keep his next visit at the agreed upon time to discuss these results. ? ?Objective:  ? ?Blood pressure 138/90, pulse 82, temperature (!) 97.2 ?F (36.2 ?C), height 5\' 11"  (1.803 m), weight 291 lb (132

## 2022-02-07 ENCOUNTER — Ambulatory Visit: Payer: 59 | Admitting: Pulmonary Disease

## 2022-02-07 ENCOUNTER — Encounter: Payer: Self-pay | Admitting: Pulmonary Disease

## 2022-02-07 VITALS — BP 126/84 | HR 82 | Ht 71.0 in | Wt 288.0 lb

## 2022-02-07 DIAGNOSIS — Z9989 Dependence on other enabling machines and devices: Secondary | ICD-10-CM

## 2022-02-07 DIAGNOSIS — G4733 Obstructive sleep apnea (adult) (pediatric): Secondary | ICD-10-CM

## 2022-02-07 NOTE — Patient Instructions (Signed)
I will see you back in about 6 months ? ?Continue using CPAP on a regular basis ? ?Call with significant concerns ?

## 2022-02-07 NOTE — Progress Notes (Signed)
? ?      Jonathan Hutchinson    [id number]    1986/05/06 ? ?Primary Care Physician:Wendling, Jilda Roche, DO ? ?Referring Physician: Sharlene Dory, DO ?[address] ?[address] ?[place],  [place] [address] ? ?Chief complaint:   ?History of snoring, witnessed apneas ?Diagnosed with severe obstructive sleep apnea ?In for follow-up today ? ?HPI: ? ?Has been using CPAP nightly ?Has noticed improvement in daytime sleepiness ?Sleep quality also appears to be better ? ?Still goes to bed about 11-12, final wake up time about 7.30-8 ? ?He appears to be tolerating CPAP well with improvement in symptoms ? ?His weight is stable ? ? ?Dad does have obstructive sleep apnea on CPAP ? ?No choking respirations at night ?Memory is fine ? 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 ?Outpatient Encounter Medications as of 02/07/2022  ?Medication Sig  ? gabapentin (NEURONTIN) 300 MG capsule Take by mouth.  ? Levocetirizine Dihydrochloride (XYZAL PO) Take by mouth.  ? fenofibrate (TRICOR) 145 MG tablet Take 1 tablet (145 mg total) by mouth daily. (Patient not taking: Reported on 01/30/2022)  ? rosuvastatin (CRESTOR) 10 MG tablet Take 1 tablet (10 mg total) by mouth daily. (Patient not taking: Reported on 01/30/2022)  ? ?No facility-administered encounter medications on file as of 02/07/2022.  ? ? ?Allergies as of 02/07/2022 - Review Complete 02/07/2022  ?Allergen Reaction Noted  ? Diclofenac Swelling 02/07/2022  ? ? ?Past Medical History:  ?Diagnosis Date  ? Back pain   ? High blood pressure   ? Joint pain   ? Mixed hyperlipidemia   ? Ruptured disk   ? Sleep apnea   ? ? ?Past Surgical History:  ?Procedure Laterality Date  ? KNEE SURGERY Right   ? testical removal    ? Removed Left  ? ? ?Family History  ?Problem Relation Age of Onset  ? Hypertension Father   ? Hyperlipidemia Father   ? ? ?Social History  ? ?Socioeconomic History  ? Marital status: Significant Other  ?  Spouse name: Nicholaus Bloom  ? Number of children: Not on file  ?  Years of education: Not on file  ? Highest education level: Not on file  ?Occupational History  ? Occupation: Emergency planning/management officer  ?Tobacco Use  ? Smoking status: Former  ? Smokeless tobacco: Never  ?Substance and Sexual Activity  ? Alcohol use: Yes  ? Drug use: No  ? Sexual activity: Not on file  ?Other Topics Concern  ? Not on file  ?Social History Narrative  ? Not on file  ? ?Social Determinants of Health  ? ?Financial Resource Strain: Not on file  ?Food Insecurity: Not on file  ?Transportation Needs: Not on file  ?Physical Activity: Not on file  ?Stress: Not on file  ?Social Connections: Not on file  ?Intimate Partner Violence: Not on file  ? ? ?Review of Systems  ?Constitutional: Negative.   ?Respiratory:  Positive for apnea.   ?Psychiatric/Behavioral:  Positive for sleep disturbance.   ? ?Vitals:  ? 02/07/22 1044  ?BP: 126/84  ?Pulse: 82  ?SpO2: 97%  ? ? ? ?Physical Exam ?Constitutional:   ?   Appearance: He is obese.  ?HENT:  ?   Head: Normocephalic.  ?   Mouth/Throat:  ?   Comments: Crowded oropharynx, Mallampati 4, macroglossia ?Cardiovascular:  ?   Rate and Rhythm: Normal rate and regular rhythm.  ?   Heart sounds: No murmur heard. ?  No friction rub.  ?Pulmonary:  ?  Effort: No respiratory distress.  ?   Breath sounds: No stridor. No wheezing or rhonchi.  ?Musculoskeletal:  ?   Cervical back: No rigidity.  ?Neurological:  ?   Mental Status: He is alert.  ?Psychiatric:     ?   Mood and Affect: Mood normal.  ? ? ?  08/29/2021  ?  9:00 AM  ?Results of the Epworth flowsheet  ?Sitting and reading 2  ?Watching TV 3  ?Sitting, inactive in a public place (e.g. a theatre or a meeting) 2  ?As a passenger in a car for an hour without a break 1  ?Lying down to rest in the afternoon when circumstances permit 3  ?Sitting and talking to someone 0  ?Sitting quietly after a lunch without alcohol 0  ?In a car, while stopped for a few minutes in traffic 0  ?Total score 11  ? ? ? ?Data Reviewed: ?Sleep study reviewed with the  patient showing severe obstructive sleep apnea ? ?Compliance data reviewed showing 97% compliance ?Average use of 4 hours 54 minutes ?Machine set between 5 and 20 ?95 percentile pressure of 16.1 ?Residual AHI of 2.7 ? ?Assessment:  ?Severe obstructive sleep apnea ? ?On CPAP therapy and tolerating it well ?-Daytime sleepiness is much better ?-Feels sleep quality is much better as well ? ?Class III obesity ? ?Plan/Recommendations: ?Continue CPAP on a nightly basis ? ?Encouraged to work on weight loss efforts ? ?Tentative follow-up in about 6 months ? ?Encouraged to call with significant concerns ? ? ?Virl Diamond MD ?[facility] ?02/07/2022, 11:07 AM ? ?CC: Wendling, Jilda Roche* ? ? ? ?

## 2022-02-11 ENCOUNTER — Ambulatory Visit: Payer: 59 | Admitting: Family Medicine

## 2022-02-11 ENCOUNTER — Encounter: Payer: Self-pay | Admitting: Family Medicine

## 2022-02-11 VITALS — BP 110/63 | HR 72 | Temp 98.5°F | Ht 71.0 in | Wt 282.4 lb

## 2022-02-11 DIAGNOSIS — E782 Mixed hyperlipidemia: Secondary | ICD-10-CM | POA: Diagnosis not present

## 2022-02-11 NOTE — Patient Instructions (Signed)
Keep the diet clean and stay active. ? ?Let's get back on the rosuvastatin.  ? ?Let us know if you need anything. ?

## 2022-02-11 NOTE — Progress Notes (Signed)
Chief Complaint  ?Patient presents with  ? Follow-up  ?  6 month ?  ? ? ?Subjective: ?Mixed Hyperlipidemia ?Patient presents for Hyperlipidemia follow up. ?Currently stopped taking fenofibrate/Crestor over past few months.  ?He denies/had no myalgias on medication. ?He is now adhering to a healthy diet. ?Exercise: none; plans to start ?The patient is not known to have coexisting coronary artery disease. ?No Cp or SOB.  ? ?Past Medical History:  ?Diagnosis Date  ? Back pain   ? High blood pressure   ? Joint pain   ? Mixed hyperlipidemia   ? Ruptured disk   ? Sleep apnea   ? ? ?Objective: ?BP 110/63   Pulse 72   Temp 98.5 ?F (36.9 ?C) (Oral)   Ht 5\' 11"  (1.803 m)   Wt 282 lb 6 oz (128.1 kg)   SpO2 97%   BMI 39.38 kg/m?  ?General: Awake, appears stated age ?HEENT: MMM ?Heart: RRR, no LE edema, no bruits ?Lungs: CTAB, no rales, wheezes or rhonchi. No accessory muscle use ?Psych: Age appropriate judgment and insight, normal affect and mood ? ?Assessment and Plan: ?Mixed hyperlipidemia - Plan: Hepatic function panel, Lipid panel ? ?Chronic, not stable.  Restart rosuvastatin 10 mg daily.  Counseled diet and exercise which she is improving.  He is following with the medical weight loss team.  He has lost around 10 pounds in the last few weeks after cutting out soda.  Recheck above labs in 6 weeks.  I will see him in 6 months for a physical pending the above results. ?The patient voiced understanding and agreement to the plan. ? ?Shelda Pal, DO ?02/11/22  ?11:54 AM ? ? ? ? ? ?

## 2022-02-19 ENCOUNTER — Ambulatory Visit (INDEPENDENT_AMBULATORY_CARE_PROVIDER_SITE_OTHER): Payer: 59 | Admitting: Family Medicine

## 2022-02-19 ENCOUNTER — Encounter (INDEPENDENT_AMBULATORY_CARE_PROVIDER_SITE_OTHER): Payer: Self-pay | Admitting: Family Medicine

## 2022-02-19 VITALS — BP 118/75 | HR 73 | Temp 97.6°F | Ht 71.0 in | Wt 272.0 lb

## 2022-02-19 DIAGNOSIS — E559 Vitamin D deficiency, unspecified: Secondary | ICD-10-CM

## 2022-02-19 DIAGNOSIS — E785 Hyperlipidemia, unspecified: Secondary | ICD-10-CM

## 2022-02-19 DIAGNOSIS — E8881 Metabolic syndrome: Secondary | ICD-10-CM

## 2022-02-19 DIAGNOSIS — G4733 Obstructive sleep apnea (adult) (pediatric): Secondary | ICD-10-CM

## 2022-02-19 DIAGNOSIS — Z9189 Other specified personal risk factors, not elsewhere classified: Secondary | ICD-10-CM

## 2022-02-19 DIAGNOSIS — Z6838 Body mass index (BMI) 38.0-38.9, adult: Secondary | ICD-10-CM

## 2022-02-19 DIAGNOSIS — R7401 Elevation of levels of liver transaminase levels: Secondary | ICD-10-CM

## 2022-02-19 DIAGNOSIS — E669 Obesity, unspecified: Secondary | ICD-10-CM

## 2022-02-19 MED ORDER — VITAMIN D (ERGOCALCIFEROL) 1.25 MG (50000 UNIT) PO CAPS: 50000.0000 [IU] | ORAL_CAPSULE | ORAL | 0 refills | Status: DC

## 2022-02-25 NOTE — Progress Notes (Signed)
Chief Complaint:   OBESITY Jonathan Hutchinson is here to discuss his progress with his obesity treatment plan along with follow-up of his obesity related diagnoses. Jonathan Hutchinson is on the Category 4 Plan and states he is following his eating plan approximately 98% of the time. Jonathan Hutchinson states he is going to the gym for 45 minutes 1 times per week.  Today's visit was #: 2 Starting weight: 291 lbs Starting date: 01/30/2022 Today's weight: 272 lbs Today's date: 02/19/2022 Total lbs lost to date: 19 lbs Total lbs lost since last in-office visit: 19 lbs  Interim History: Jonathan Hutchinson feels diet is easy to follow and misses some things he previously ate. He doesn't want to make any changes to meal plan. He does want to start exercising more. He has no upcoming plans. He is not usually getting in snack calories.   Subjective:   1. Dyslipidemia Trice's last LDL was 120. His HDL was 30. His Triglycerides was 286. He wasn't taking rosuvastatin or Tricor consistently. He has had significantly elevated Triglycerides in the past (he was on steroids at that time).  2. Vitamin D deficiency Jonathan Hutchinson is currently on Vitamin D supplementation.   3. Insulin resistance Jonathan Hutchinson's insulin level was 32.8. His A1C was 5.1. He notes minimal cravings and denies hunger.   4. Transaminitis Jonathan Hutchinson's ALT was 70. His AST was 49. ALK phos 75. He denies ultrasound or CT on file but is on statin now. 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  5. OSA (obstructive sleep apnea) Jonathan Hutchinson has CPAP that he wears for at least 4 hours per night. He wears nasal cannula.   6. At risk of diabetes mellitus Jonathan Hutchinson is at higher than average risk for developing diabetes due to his obesity.   Assessment/Plan:   1. Dyslipidemia Cardiovascular risk and specific lipid/LDL goals reviewed.  Jonathan Hutchinson will continue Crestor. We will repeat labs in 3 months. We discussed several lifestyle modifications today and Collis will continue to work on diet, exercise and weight loss efforts. Orders  and follow up as documented in patient record.   Counseling Intensive lifestyle modifications are the first line treatment for this issue. Dietary changes: Increase soluble fiber. Decrease simple carbohydrates. Exercise changes: Moderate to vigorous-intensity aerobic activity 150 minutes per week if tolerated. Lipid-lowering medications: see documented in medical record.  2. Vitamin D deficiency Low Vitamin D level contributes to fatigue and are associated with obesity, breast, and colon cancer. Jonathan Hutchinson agrees to start prescription Vitamin D 50,000 IU every week for 1 month with no refills and he will follow-up for routine testing of Vitamin D, at least 2-3 times per year to avoid over-replacement.  - Vitamin D, Ergocalciferol, (DRISDOL) 1.25 MG (50000 UNIT) CAPS capsule; Take 1 capsule (50,000 Units total) by mouth every 7 (seven) days.  Dispense: 4 capsule; Refill: 0  3. Insulin resistance We will repeat labs in 3 months. He will continue Category 4. Pathophysiology of Insulin resistance discussed today. Jonathan Hutchinson will continue to work on weight loss, exercise, and decreasing simple carbohydrates to help decrease the risk of diabetes. Jonathan Hutchinson agreed to follow-up with [place] as directed to closely monitor his progress.  4. Transaminitis We will obtain labs in 3 months.  5. OSA (obstructive sleep apnea) Jonathan Hutchinson will follow up on compliance at next office visit. Intensive lifestyle modifications are the first line treatment for this issue. We discussed several lifestyle modifications today and he will continue to work on diet, exercise and weight loss efforts. We will continue to monitor. Orders and follow up  as documented in patient record.    6. At risk of diabetes mellitus Jonathan Hutchinson was given approximately 15 minutes of diabetic education and counseling today. We discussed intensive lifestyle modifications today with an emphasis on weight loss as well as increasing exercise and decreasing simple  carbohydrates in his diet. We also reviewed medication options with an emphasis on risk versus benefits of those discussed.  Repetitive spaced learning was employed today to elicit superior memory formation and behavioral change.   7. Obesity with current BMI of 38.0 Jonathan Hutchinson is currently in the action stage of change. As such, his goal is to continue with weight loss efforts. He has agreed to the Category 4 Plan. Jonathan Hutchinson is encouraged to get snack calories in daily.   Exercise goals: No exercise has been prescribed at this time.  Behavioral modification strategies: increasing lean protein intake, decreasing eating out, meal planning and cooking strategies, and keeping healthy foods in the home.  Jonathan Hutchinson has agreed to follow-up with our clinic in 2-3 weeks. He was informed of the importance of frequent follow-up visits to maximize his success with intensive lifestyle modifications for his multiple health conditions.   Objective:   Blood pressure 118/75, pulse 73, temperature 97.6 F (36.4 C), height 5' 11"  (1.803 m), weight 272 lb (123.4 kg), SpO2 98 %. Body mass index is 37.94 kg/m.  General: Cooperative, alert, well developed, in no acute distress. HEENT: Conjunctivae and lids unremarkable. Cardiovascular: Regular rhythm.  Lungs: Normal work of breathing. Neurologic: No focal deficits.   Lab Results  Component Value Date   CREATININE 0.88 01/30/2022   BUN 9 01/30/2022   NA 142 01/30/2022   K 3.7 01/30/2022   CL 102 01/30/2022   CO2 23 01/30/2022   Lab Results  Component Value Date   ALT 70 (H) 01/30/2022   AST 49 (H) 01/30/2022   ALKPHOS 75 01/30/2022   BILITOT 1.3 (H) 01/30/2022   Lab Results  Component Value Date   HGBA1C 5.1 01/30/2022   Lab Results  Component Value Date   INSULIN 32.8 (H) 01/30/2022   Lab Results  Component Value Date   TSH 2.020 01/30/2022   Lab Results  Component Value Date   CHOL 200 (H) 01/30/2022   HDL 30 (L) 01/30/2022   LDLCALC 120  (H) 01/30/2022   LDLDIRECT 84.0 09/05/2021   TRIG 286 (H) 01/30/2022   CHOLHDL 8 09/05/2021   Lab Results  Component Value Date   VD25OH 16.8 (L) 01/30/2022   Lab Results  Component Value Date   WBC 8.4 07/24/2021   HGB 16.0 07/24/2021   HCT 46.3 07/24/2021   MCV 86.2 07/24/2021   PLT 233.0 07/24/2021   No results found for: IRON, TIBC, FERRITIN  Attestation Statements:   Reviewed by clinician on day of visit: allergies, medications, problem list, medical history, surgical history, family history, social history, and previous encounter notes.  I, Lizbeth Bark, RMA, am acting as transcriptionist for Coralie Common, MD.   I have reviewed the above documentation for accuracy and completeness, and I agree with the above. - Coralie Common, MD

## 2022-03-06 ENCOUNTER — Ambulatory Visit (INDEPENDENT_AMBULATORY_CARE_PROVIDER_SITE_OTHER): Payer: 59 | Admitting: Bariatrics

## 2022-03-06 ENCOUNTER — Encounter (INDEPENDENT_AMBULATORY_CARE_PROVIDER_SITE_OTHER): Payer: Self-pay | Admitting: Bariatrics

## 2022-03-06 VITALS — BP 123/77 | HR 95 | Temp 97.6°F | Ht 71.0 in | Wt 268.0 lb

## 2022-03-06 DIAGNOSIS — E785 Hyperlipidemia, unspecified: Secondary | ICD-10-CM | POA: Diagnosis not present

## 2022-03-06 DIAGNOSIS — E559 Vitamin D deficiency, unspecified: Secondary | ICD-10-CM | POA: Diagnosis not present

## 2022-03-06 DIAGNOSIS — E669 Obesity, unspecified: Secondary | ICD-10-CM

## 2022-03-06 DIAGNOSIS — E65 Localized adiposity: Secondary | ICD-10-CM

## 2022-03-06 DIAGNOSIS — Z6837 Body mass index (BMI) 37.0-37.9, adult: Secondary | ICD-10-CM

## 2022-03-06 MED ORDER — VITAMIN D (ERGOCALCIFEROL) 1.25 MG (50000 UNIT) PO CAPS: 50000.0000 [IU] | ORAL_CAPSULE | ORAL | 0 refills | Status: DC

## 2022-03-08 NOTE — Progress Notes (Signed)
Chief Complaint:   OBESITY Jonathan Hutchinson is here to discuss his progress with his obesity treatment plan along with follow-up of his obesity related diagnoses. Jonathan Hutchinson is on the Category 4 Plan and states he is following his eating plan approximately 100% of the time. Jonathan Hutchinson states he is doing 0 minutes 0 times per week.  Today's visit was #: 3 Starting weight: 291 lbs Starting date: 01/30/2022 Today's weight: 268 lbs Today's date: 03/06/2022 Total lbs lost to date: 23 lbs Total lbs lost since last in-office visit: 4 lbs  Interim History: Jonathan Hutchinson is down an additional 4 lbs since his last visit. He is hungry before a meal.   Subjective:   1. Vitamin D deficiency Jonathan Hutchinson is taking Vitamin D.   2. Dyslipidemia with high LDL and low HDL Jonathan Hutchinson is currently taking Crestor.   3. Visceral obesity Visceral obesity score was high but coming down.   Assessment/Plan:   1. Vitamin D deficiency Low Vitamin D level contributes to fatigue and are associated with obesity, breast, and colon cancer. We will refil prescription Vitamin D 50,000 IU every week for 1 month with no refills and Jonathan Hutchinson will follow-up for routine testing of Vitamin D, at least 2-3 times per year to avoid over-replacement.  - Vitamin D, Ergocalciferol, (DRISDOL) 1.25 MG (50000 UNIT) CAPS capsule; Take 1 capsule (50,000 Units total) by mouth every 7 (seven) days.  Dispense: 4 capsule; Refill: 0  2. Dyslipidemia with high LDL and low HDL Cardiovascular risk and specific lipid/LDL goals reviewed.  Jonathan Hutchinson will have no trans fats and he will have limited saturated fats. We discussed several lifestyle modifications today and Jonathan Hutchinson will continue to work on diet, exercise and weight loss efforts. Orders and follow up as documented in patient record.   Counseling Intensive lifestyle modifications are the first line treatment for this issue. Dietary changes: Increase soluble fiber. Decrease simple carbohydrates. Exercise changes:  Moderate to vigorous-intensity aerobic activity 150 minutes per week if tolerated. Lipid-lowering medications: see documented in medical record.  3. Visceral obesity Jonathan Hutchinson will continue the plan and exercise. Visceral obesity score was 19, but now is down to 15 ( normal 13 or below ).   4. Obesity, Current BMI 37.5 Jonathan Hutchinson is currently in the action stage of change. As such, his goal is to continue with weight loss efforts. He has agreed to the Category 4 Plan.   Jonathan Hutchinson will continue meal planning and he will continue intentional eating. He will keep his water high and his protein.   Exercise goals:  Jonathan Hutchinson is walking and he states he will begin yardwork.   Behavioral modification strategies: increasing lean protein intake, decreasing simple carbohydrates, increasing vegetables, increasing water intake, decreasing eating out, no skipping meals, meal planning and cooking strategies, keeping healthy foods in the home, and planning for success.  Jonathan Hutchinson has agreed to follow-up with our clinic in 2-3 weeks with Dr. Lawson Radar. He was informed of the importance of frequent follow-up visits to maximize his success with intensive lifestyle modifications for his multiple health conditions.   Objective:   Blood pressure 123/77, pulse 95, temperature 97.6 F (36.4 C), height 5\' 11"  (1.803 m), weight 268 lb (121.6 kg), SpO2 95 %. Body mass index is 37.38 kg/m.  General: Cooperative, alert, well developed, in no acute distress. HEENT: Conjunctivae and lids unremarkable. Cardiovascular: Regular rhythm.  Lungs: Normal work of breathing. Neurologic: No focal deficits.   Lab Results  Component Value Date   CREATININE 0.88 01/30/2022  BUN 9 01/30/2022   NA 142 01/30/2022   K 3.7 01/30/2022   CL 102 01/30/2022   CO2 23 01/30/2022   Lab Results  Component Value Date   ALT 70 (H) 01/30/2022   AST 49 (H) 01/30/2022   ALKPHOS 75 01/30/2022   BILITOT 1.3 (H) 01/30/2022   Lab Results  Component  Value Date   HGBA1C 5.1 01/30/2022   Lab Results  Component Value Date   INSULIN 32.8 (H) 01/30/2022   Lab Results  Component Value Date   TSH 2.020 01/30/2022   Lab Results  Component Value Date   CHOL 200 (H) 01/30/2022   HDL 30 (L) 01/30/2022   LDLCALC 120 (H) 01/30/2022   LDLDIRECT 84.0 09/05/2021   TRIG 286 (H) 01/30/2022   CHOLHDL 8 09/05/2021   Lab Results  Component Value Date   VD25OH 16.8 (L) 01/30/2022   Lab Results  Component Value Date   WBC 8.4 07/24/2021   HGB 16.0 07/24/2021   HCT 46.3 07/24/2021   MCV 86.2 07/24/2021   PLT 233.0 07/24/2021   No results found for: IRON, TIBC, FERRITIN  Attestation Statements:   Reviewed by clinician on day of visit: allergies, medications, problem list, medical history, surgical history, family history, social history, and previous encounter notes.  I, Jackson Latino, RMA, am acting as Energy manager for [employer], DO.  I have reviewed the above documentation for accuracy and completeness, and I agree with the above. Corinna Capra, DO

## 2022-03-13 ENCOUNTER — Encounter (INDEPENDENT_AMBULATORY_CARE_PROVIDER_SITE_OTHER): Payer: Self-pay | Admitting: Bariatrics

## 2022-03-20 ENCOUNTER — Encounter (INDEPENDENT_AMBULATORY_CARE_PROVIDER_SITE_OTHER): Payer: Self-pay | Admitting: Bariatrics

## 2022-03-20 ENCOUNTER — Ambulatory Visit (INDEPENDENT_AMBULATORY_CARE_PROVIDER_SITE_OTHER): Payer: 59 | Admitting: Bariatrics

## 2022-03-20 VITALS — BP 121/81 | HR 85 | Temp 97.5°F | Ht 71.0 in | Wt 265.0 lb

## 2022-03-20 DIAGNOSIS — E559 Vitamin D deficiency, unspecified: Secondary | ICD-10-CM | POA: Diagnosis not present

## 2022-03-20 DIAGNOSIS — E781 Pure hyperglyceridemia: Secondary | ICD-10-CM

## 2022-03-20 DIAGNOSIS — Z6837 Body mass index (BMI) 37.0-37.9, adult: Secondary | ICD-10-CM

## 2022-03-20 DIAGNOSIS — E669 Obesity, unspecified: Secondary | ICD-10-CM

## 2022-03-20 NOTE — Progress Notes (Signed)
Chief Complaint:   OBESITY Jonathan Hutchinson is here to discuss his progress with his obesity treatment plan along with follow-up of his obesity related diagnoses. Jonathan Hutchinson is on the Category 4 Plan and states he is following his eating plan approximately 95% of the time. Jonathan Hutchinson states he is doing 0 minutes 0 times per week.  Today's visit was #: 4 Starting weight: 291 lbs Starting date: 01/30/2022 Today's weight: 265 lbs Today's date: 03/20/2022 Total lbs lost to date: 26 lbs Total lbs lost since last in-office visit: 3 lbs  Interim History: Jonathan Hutchinson is down 3 lbs since his last visit.   Subjective:   1. Vitamin D deficiency Jonathan Hutchinson is taking Vitamin D currently.   2. Hypertriglyceridemia Jonathan Hutchinson is currently taking Crestor. He denies myalgias. His last Triglycerides was 286.  Assessment/Plan:   1. Vitamin D deficiency Low Vitamin D level contributes to fatigue and are associated with obesity, breast, and colon cancer. Jonathan Hutchinson will continue prescription Vitamin D 50,000 IU every week and he will follow-up for routine testing of Vitamin D, at least 2-3 times per year to avoid over-replacement.  2. Hypertriglyceridemia Cardiovascular risk and specific lipid/LDL goals reviewed.  Jonathan Hutchinson will increase activities and exercise. He will keep carbohydrates low (sugar and starches). We discussed several lifestyle modifications today and Jonathan Hutchinson will continue to work on diet, exercise and weight loss efforts. Orders and follow up as documented in patient record.   Counseling Intensive lifestyle modifications are the first line treatment for this issue. Dietary changes: Increase soluble fiber. Decrease simple carbohydrates. Exercise changes: Moderate to vigorous-intensity aerobic activity 150 minutes per week if tolerated. Lipid-lowering medications: see documented in medical record.  3. Obesity, Current BMI 37.0 Jonathan Hutchinson is currently in the action stage of change. As such, his goal is to continue with  weight loss efforts. He has agreed to the Category 4 Plan.   Jonathan Hutchinson will continue meal planning and he will be mindful eating.   Exercise goals:  Jonathan Hutchinson is not exercising and will try to walk.   Behavioral modification strategies: increasing lean protein intake, decreasing simple carbohydrates, increasing vegetables, increasing water intake, decreasing eating out, no skipping meals, meal planning and cooking strategies, keeping healthy foods in the home, and planning for success.  Jonathan Hutchinson has agreed to follow-up with our clinic in 2 weeks. He was informed of the importance of frequent follow-up visits to maximize his success with intensive lifestyle modifications for his multiple health conditions.   Objective:   Blood pressure 121/81, pulse 85, temperature (!) 97.5 F (36.4 C), height 5\' 11"  (1.803 m), weight 265 lb (120.2 kg), SpO2 97 %. Body mass index is 36.96 kg/m.  General: Cooperative, alert, well developed, in no acute distress. HEENT: Conjunctivae and lids unremarkable. Cardiovascular: Regular rhythm.  Lungs: Normal work of breathing. Neurologic: No focal deficits.   Lab Results  Component Value Date   CREATININE 0.88 01/30/2022   BUN 9 01/30/2022   NA 142 01/30/2022   K 3.7 01/30/2022   CL 102 01/30/2022   CO2 23 01/30/2022   Lab Results  Component Value Date   ALT 70 (H) 01/30/2022   AST 49 (H) 01/30/2022   ALKPHOS 75 01/30/2022   BILITOT 1.3 (H) 01/30/2022   Lab Results  Component Value Date   HGBA1C 5.1 01/30/2022   Lab Results  Component Value Date   INSULIN 32.8 (H) 01/30/2022   Lab Results  Component Value Date   TSH 2.020 01/30/2022   Lab Results  Component  Value Date   CHOL 200 (H) 01/30/2022   HDL 30 (L) 01/30/2022   LDLCALC 120 (H) 01/30/2022   LDLDIRECT 84.0 09/05/2021   TRIG 286 (H) 01/30/2022   CHOLHDL 8 09/05/2021   Lab Results  Component Value Date   VD25OH 16.8 (L) 01/30/2022   Lab Results  Component Value Date   WBC 8.4  07/24/2021   HGB 16.0 07/24/2021   HCT 46.3 07/24/2021   MCV 86.2 07/24/2021   PLT 233.0 07/24/2021   No results found for: "IRON", "TIBC", "FERRITIN"  Attestation Statements:   Reviewed by clinician on day of visit: allergies, medications, problem list, medical history, surgical history, family history, social history, and previous encounter notes.  I, Lizbeth Bark, RMA, am acting as Location manager for [employer], DO.  I have reviewed the above documentation for accuracy and completeness, and I agree with the above. Jearld Lesch, DO

## 2022-03-25 ENCOUNTER — Encounter (INDEPENDENT_AMBULATORY_CARE_PROVIDER_SITE_OTHER): Payer: Self-pay | Admitting: Bariatrics

## 2022-03-28 ENCOUNTER — Other Ambulatory Visit (INDEPENDENT_AMBULATORY_CARE_PROVIDER_SITE_OTHER): Payer: 59

## 2022-03-28 DIAGNOSIS — E782 Mixed hyperlipidemia: Secondary | ICD-10-CM | POA: Diagnosis not present

## 2022-03-28 LAB — LIPID PANEL
Cholesterol: 140 mg/dL (ref 0–200)
HDL: 29.6 mg/dL — ABNORMAL LOW (ref >39.00)
NonHDL: 110.05
Total CHOL/HDL Ratio: 5
Triglycerides: 277 mg/dL — ABNORMAL HIGH (ref 0.0–149.0)
VLDL: 55.4 mg/dL — ABNORMAL HIGH (ref 0.0–40.0)

## 2022-03-28 LAB — HEPATIC FUNCTION PANEL
ALT: 26 U/L (ref 0–53)
AST: 18 U/L (ref 0–37)
Albumin: 4.7 g/dL (ref 3.5–5.2)
Alkaline Phosphatase: 57 U/L (ref 39–117)
Bilirubin, Direct: 0.2 mg/dL (ref 0.0–0.3)
Total Bilirubin: 0.8 mg/dL (ref 0.2–1.2)
Total Protein: 6.8 g/dL (ref 6.0–8.3)

## 2022-03-28 LAB — LDL CHOLESTEROL, DIRECT: Direct LDL: 69 mg/dL

## 2022-04-15 ENCOUNTER — Telehealth: Payer: Self-pay

## 2022-04-15 ENCOUNTER — Ambulatory Visit (INDEPENDENT_AMBULATORY_CARE_PROVIDER_SITE_OTHER): Payer: 59 | Admitting: Bariatrics

## 2022-04-15 ENCOUNTER — Encounter (INDEPENDENT_AMBULATORY_CARE_PROVIDER_SITE_OTHER): Payer: Self-pay | Admitting: Bariatrics

## 2022-04-15 VITALS — BP 122/77 | HR 75 | Temp 97.9°F | Ht 71.0 in | Wt 257.0 lb

## 2022-04-15 DIAGNOSIS — Z6841 Body Mass Index (BMI) 40.0 and over, adult: Secondary | ICD-10-CM

## 2022-04-15 DIAGNOSIS — E785 Hyperlipidemia, unspecified: Secondary | ICD-10-CM | POA: Diagnosis not present

## 2022-04-15 DIAGNOSIS — Z6835 Body mass index (BMI) 35.0-35.9, adult: Secondary | ICD-10-CM | POA: Diagnosis not present

## 2022-04-15 DIAGNOSIS — E669 Obesity, unspecified: Secondary | ICD-10-CM

## 2022-04-15 DIAGNOSIS — E559 Vitamin D deficiency, unspecified: Secondary | ICD-10-CM | POA: Diagnosis not present

## 2022-04-15 MED ORDER — VITAMIN D (ERGOCALCIFEROL) 1.25 MG (50000 UNIT) PO CAPS: 50000.0000 [IU] | ORAL_CAPSULE | ORAL | 0 refills | Status: DC

## 2022-04-15 NOTE — Telephone Encounter (Signed)
Patient and his significant other Mrs. Nicoletta Ba DOB 10/21/1979, called to see if Dr. [name] will accept her as a new patient. She was advised not accepting NP at this time (she is 85).  They asked for a message to be sent to provider. Please advise.

## 2022-04-15 NOTE — Telephone Encounter (Signed)
OK 

## 2022-04-15 NOTE — Progress Notes (Unsigned)
Chief Complaint:   OBESITY Jonathan Hutchinson is here to discuss his progress with his obesity treatment plan along with follow-up of his obesity related diagnoses. Jonathan Hutchinson is on the Category 4 Plan and states he is following his eating plan approximately 100% of the time. Jonathan Hutchinson states he is doing cardio and resistance for 30-45 minutes 3 times per week.  Today's visit was #: 5 Starting weight: 291 lbs Starting date: 01/30/2022 Today's weight: 257 lbs Today's date: 04/15/2022 Total lbs lost to date: 34 Total lbs lost since last in-office visit: 8  Interim History: Jonathan Hutchinson is down an additional 8 pounds and he is doing well overall.  Subjective:   1. Vitamin D deficiency Jonathan Hutchinson is taking vitamin D, and he notes he is sleeping good.  2. Dyslipidemia Jonathan Hutchinson is taking Crestor.  He had lab testings on 03/28/2022.  Assessment/Plan:   1. Vitamin D deficiency Jonathan Hutchinson will continue prescription vitamin D 50,000 units once weekly, and we will refill for 1 month.  - Vitamin D, Ergocalciferol, (DRISDOL) 1.25 MG (50000 UNIT) CAPS capsule; Take 1 capsule (50,000 Units total) by mouth every 7 (seven) days.  Dispense: 4 capsule; Refill: 0  2. Dyslipidemia Jonathan Hutchinson will continue Crestor, and he will decrease carbohydrates, and decrease unhealthy fats.  3. Obesity, Current BMI 35.9 Jonathan Hutchinson is currently in the action stage of change. As such, his goal is to continue with weight loss efforts. He has agreed to the Category 4 Plan.   He will continue to adhere closely to the plan 90-100%.  We will recheck fasting labs at his next visit.  Exercise goals: As is.   Behavioral modification strategies: increasing lean protein intake, decreasing simple carbohydrates, increasing vegetables, increasing water intake, decreasing eating out, no skipping meals, meal planning and cooking strategies, keeping healthy foods in the home, and planning for success.  Jonathan Hutchinson has agreed to follow-up with our clinic in 2 to 3  weeks. He was informed of the importance of frequent follow-up visits to maximize his success with intensive lifestyle modifications for his multiple health conditions.   Objective:   Blood pressure 122/77, pulse 75, temperature 97.9 F (36.6 C), height 5\' 11"  (1.803 m), weight 257 lb (116.6 kg), SpO2 97 %. Body mass index is 35.84 kg/m.  General: Cooperative, alert, well developed, in no acute distress. HEENT: Conjunctivae and lids unremarkable. Cardiovascular: Regular rhythm.  Lungs: Normal work of breathing. Neurologic: No focal deficits.   Lab Results  Component Value Date   CREATININE 0.88 01/30/2022   BUN 9 01/30/2022   NA 142 01/30/2022   K 3.7 01/30/2022   CL 102 01/30/2022   CO2 23 01/30/2022   Lab Results  Component Value Date   ALT 26 03/28/2022   AST 18 03/28/2022   ALKPHOS 57 03/28/2022   BILITOT 0.8 03/28/2022   Lab Results  Component Value Date   HGBA1C 5.1 01/30/2022   Lab Results  Component Value Date   INSULIN 32.8 (H) 01/30/2022   Lab Results  Component Value Date   TSH 2.020 01/30/2022   Lab Results  Component Value Date   CHOL 140 03/28/2022   HDL 29.60 (L) 03/28/2022   LDLCALC 120 (H) 01/30/2022   LDLDIRECT 69.0 03/28/2022   TRIG 277.0 (H) 03/28/2022   CHOLHDL 5 03/28/2022   Lab Results  Component Value Date   VD25OH 16.8 (L) 01/30/2022   Lab Results  Component Value Date   WBC 8.4 07/24/2021   HGB 16.0 07/24/2021   HCT 46.3  07/24/2021   MCV 86.2 07/24/2021   PLT 233.0 07/24/2021   No results found for: "IRON", "TIBC", "FERRITIN"  Attestation Statements:   Reviewed by clinician on day of visit: allergies, medications, problem list, medical history, surgical history, family history, social history, and previous encounter notes.   Trude Mcburney, am acting as Energy manager for [employer], DO.  I have reviewed the above documentation for accuracy and completeness, and I agree with the above. Corinna Capra, DO

## 2022-04-16 ENCOUNTER — Ambulatory Visit (INDEPENDENT_AMBULATORY_CARE_PROVIDER_SITE_OTHER): Payer: 59 | Admitting: Bariatrics

## 2022-04-16 ENCOUNTER — Encounter (INDEPENDENT_AMBULATORY_CARE_PROVIDER_SITE_OTHER): Payer: Self-pay | Admitting: Bariatrics

## 2022-04-16 NOTE — Telephone Encounter (Signed)
Spoke to husband, will call his wife at [phone] to schedule appt.

## 2022-05-15 ENCOUNTER — Encounter (INDEPENDENT_AMBULATORY_CARE_PROVIDER_SITE_OTHER): Payer: Self-pay

## 2022-05-20 ENCOUNTER — Ambulatory Visit (INDEPENDENT_AMBULATORY_CARE_PROVIDER_SITE_OTHER): Payer: 59 | Admitting: Bariatrics

## 2022-05-20 ENCOUNTER — Encounter (INDEPENDENT_AMBULATORY_CARE_PROVIDER_SITE_OTHER): Payer: Self-pay | Admitting: Bariatrics

## 2022-05-20 VITALS — BP 119/77 | HR 68 | Temp 97.6°F | Ht 71.0 in | Wt 248.0 lb

## 2022-05-20 DIAGNOSIS — E785 Hyperlipidemia, unspecified: Secondary | ICD-10-CM | POA: Diagnosis not present

## 2022-05-20 DIAGNOSIS — E559 Vitamin D deficiency, unspecified: Secondary | ICD-10-CM

## 2022-05-20 DIAGNOSIS — R748 Abnormal levels of other serum enzymes: Secondary | ICD-10-CM

## 2022-05-20 DIAGNOSIS — Z6834 Body mass index (BMI) 34.0-34.9, adult: Secondary | ICD-10-CM

## 2022-05-20 DIAGNOSIS — E8881 Metabolic syndrome: Secondary | ICD-10-CM | POA: Diagnosis not present

## 2022-05-20 DIAGNOSIS — E669 Obesity, unspecified: Secondary | ICD-10-CM

## 2022-05-20 MED ORDER — VITAMIN D (ERGOCALCIFEROL) 1.25 MG (50000 UNIT) PO CAPS: 50000.0000 [IU] | ORAL_CAPSULE | ORAL | 0 refills | Status: DC

## 2022-05-21 LAB — COMPREHENSIVE METABOLIC PANEL
ALT: 32 IU/L (ref 0–44)
AST: 20 IU/L (ref 0–40)
Albumin/Globulin Ratio: 2.4 — ABNORMAL HIGH (ref 1.2–2.2)
Albumin: 4.6 g/dL (ref 4.1–5.1)
Alkaline Phosphatase: 60 IU/L (ref 44–121)
BUN/Creatinine Ratio: 16 (ref 9–20)
BUN: 18 mg/dL (ref 6–20)
Bilirubin Total: 0.6 mg/dL (ref 0.0–1.2)
CO2: 23 mmol/L (ref 20–29)
Calcium: 9 mg/dL (ref 8.7–10.2)
Chloride: 105 mmol/L (ref 96–106)
Creatinine, Ser: 1.12 mg/dL (ref 0.76–1.27)
Globulin, Total: 1.9 g/dL (ref 1.5–4.5)
Glucose: 83 mg/dL (ref 70–99)
Potassium: 4.2 mmol/L (ref 3.5–5.2)
Sodium: 142 mmol/L (ref 134–144)
Total Protein: 6.5 g/dL (ref 6.0–8.5)
eGFR: 87 mL/min/{1.73_m2} (ref >59)

## 2022-05-21 LAB — LIPID PANEL WITH LDL/HDL RATIO
Cholesterol, Total: 165 mg/dL (ref 100–199)
HDL: 37 mg/dL — ABNORMAL LOW
LDL Chol Calc (NIH): 100 mg/dL — ABNORMAL HIGH (ref 0–99)
LDL/HDL Ratio: 2.7 ratio (ref 0.0–3.6)
Triglycerides: 160 mg/dL — ABNORMAL HIGH (ref 0–149)
VLDL Cholesterol Cal: 28 mg/dL (ref 5–40)

## 2022-05-21 LAB — VITAMIN D 25 HYDROXY (VIT D DEFICIENCY, FRACTURES): Vit D, 25-Hydroxy: 37.5 ng/mL (ref 30.0–100.0)

## 2022-05-23 NOTE — Progress Notes (Signed)
Chief Complaint:   OBESITY Jonathan Hutchinson is here to discuss his progress with his obesity treatment plan along with follow-up of his obesity related diagnoses. Naol is on the Category 4 Plan and states he is following his eating plan approximately 85-90% of the time. Raudel states he is doing cardio and resistance for 60 minutes 4-5 times per week.  Today's visit was #: 6 Starting weight: 291 lbs Starting date: 01/30/2022 Today's weight: 248 lbs Today's date: 05/20/22 Total lbs lost to date: 43 Total lbs lost since last in-office visit: -9  Interim History: He is down another 9 pounds since his last visit and is doing well.  He denies any struggles.  Subjective:   1. Vitamin D deficiency Taking vitamin D.  Good energy.  2. Dyslipidemia with high LDL and low HDL Taking Crestor.  3. Elevated liver enzymes No Tylenol.  No excessive alcohol.  4. Insulin resistance No medications. Insulin 32.8.  Assessment/Plan:   1. Vitamin D deficiency Refill - Vitamin D, Ergocalciferol, (DRISDOL) 1.25 MG (50000 UNIT) CAPS capsule; Take 1 capsule (50,000 Units total) by mouth every 7 (seven) days.  Dispense: 4 capsule; Refill: 0 - VITAMIN D 25 Hydroxy (Vit-D Deficiency, Fractures)  2. Dyslipidemia with high LDL and low HDL 1. Continue Crestor 2.  Lipid panel - Lipid Panel With LDL/HDL Ratio   3. Elevated liver enzymes Check CMP. - Comprehensive metabolic panel  4. Insulin resistance Carbohydrates low.  5. Obesity, Current BMI 34.7 1.  Meal planning 2.  Intentional eating 3.  Increase water.  Lafe is currently in the action stage of change. As such, his goal is to continue with weight loss efforts. He has agreed to the Category 4 Plan.   Exercise goals: Going to the gym - treadmill, elliptical and resistance.  Behavioral modification strategies: increasing lean protein intake, decreasing simple carbohydrates, increasing vegetables, increasing water intake, decreasing eating  out, no skipping meals, meal planning and cooking strategies, and keeping healthy foods in the home.  Ulice has agreed to follow-up with our clinic in 3 weeks. He was informed of the importance of frequent follow-up visits to maximize his success with intensive lifestyle modifications for his multiple health conditions.   Prescott was informed we would discuss his lab results at his next visit unless there is a critical issue that needs to be addressed sooner. Jaskarn agreed to keep his next visit at the agreed upon time to discuss these results.  Objective:   Blood pressure 119/77, pulse 68, temperature 97.6 F (36.4 C), height 5\' 11"  (1.803 m), weight 248 lb (112.5 kg), SpO2 98 %. Body mass index is 34.59 kg/m.  General: Cooperative, alert, well developed, in no acute distress. HEENT: Conjunctivae and lids unremarkable. Cardiovascular: Regular rhythm.  Lungs: Normal work of breathing. Neurologic: No focal deficits.   Lab Results  Component Value Date   CREATININE 1.12 05/20/2022   BUN 18 05/20/2022   NA 142 05/20/2022   K 4.2 05/20/2022   CL 105 05/20/2022   CO2 23 05/20/2022   Lab Results  Component Value Date   ALT 32 05/20/2022   AST 20 05/20/2022   ALKPHOS 60 05/20/2022   BILITOT 0.6 05/20/2022   Lab Results  Component Value Date   HGBA1C 5.1 01/30/2022   Lab Results  Component Value Date   INSULIN 32.8 (H) 01/30/2022   Lab Results  Component Value Date   TSH 2.020 01/30/2022   Lab Results  Component Value Date   CHOL  165 05/20/2022   HDL 37 (L) 05/20/2022   LDLCALC 100 (H) 05/20/2022   LDLDIRECT 69.0 03/28/2022   TRIG 160 (H) 05/20/2022   CHOLHDL 5 03/28/2022   Lab Results  Component Value Date   VD25OH 37.5 05/20/2022   VD25OH 16.8 (L) 01/30/2022   Lab Results  Component Value Date   WBC 8.4 07/24/2021   HGB 16.0 07/24/2021   HCT 46.3 07/24/2021   MCV 86.2 07/24/2021   PLT 233.0 07/24/2021   No results found for: "IRON", "TIBC",  "FERRITIN"   Attestation Statements:   Reviewed by clinician on day of visit: allergies, medications, problem list, medical history, surgical history, family history, social history, and previous encounter notes.  I, Dawn Whitmire, FNP-C, am acting as transcriptionist for Dr. Corinna Capra.  I have reviewed the above documentation for accuracy and completeness, and I agree with the above. Corinna Capra, DO

## 2022-05-27 ENCOUNTER — Encounter (INDEPENDENT_AMBULATORY_CARE_PROVIDER_SITE_OTHER): Payer: Self-pay | Admitting: Bariatrics

## 2022-06-11 ENCOUNTER — Encounter (INDEPENDENT_AMBULATORY_CARE_PROVIDER_SITE_OTHER): Payer: Self-pay | Admitting: Family Medicine

## 2022-06-11 ENCOUNTER — Ambulatory Visit (INDEPENDENT_AMBULATORY_CARE_PROVIDER_SITE_OTHER): Payer: 59 | Admitting: Family Medicine

## 2022-06-11 VITALS — BP 107/69 | HR 85 | Temp 98.1°F | Ht 71.0 in | Wt 239.0 lb

## 2022-06-11 DIAGNOSIS — R7989 Other specified abnormal findings of blood chemistry: Secondary | ICD-10-CM

## 2022-06-11 DIAGNOSIS — E8881 Metabolic syndrome: Secondary | ICD-10-CM

## 2022-06-11 DIAGNOSIS — E669 Obesity, unspecified: Secondary | ICD-10-CM | POA: Diagnosis not present

## 2022-06-11 DIAGNOSIS — Z6833 Body mass index (BMI) 33.0-33.9, adult: Secondary | ICD-10-CM

## 2022-06-11 DIAGNOSIS — E559 Vitamin D deficiency, unspecified: Secondary | ICD-10-CM

## 2022-06-11 MED ORDER — VITAMIN D (ERGOCALCIFEROL) 1.25 MG (50000 UNIT) PO CAPS: 50000.0000 [IU] | ORAL_CAPSULE | ORAL | 0 refills | Status: DC

## 2022-06-18 NOTE — Progress Notes (Signed)
Chief Complaint:   OBESITY Jonathan Hutchinson is here to discuss his progress with his obesity treatment plan along with follow-up of his obesity related diagnoses. Jonathan Hutchinson is on the Category 4 Plan and states he is following his eating plan approximately 80% of the time. Jonathan Hutchinson states he is at the gym 60-90 minutes 4-5 times per week.  Today's visit was #: 7 Starting weight: 291 lbs Starting date: 01/30/2022 Today's weight: 239 lbs Today's date: 06/11/2022 Total lbs lost to date: 52 lbs Total lbs lost since last in-office visit: 9 lbs  Interim History: Doing fairly well with Category 4 meal plan.  Denies hunger.  Has some cravings for fries.  Consistently going to the gym 4-5 times per week.   Subjective:   1. Vitamin D deficiency Discussed labs with patient today. He is currently taking prescription vitamin D 50,000 IU each week. He denies nausea, vomiting or muscle weakness.  Vitamin D level improved 16.8 to 37.5.  2. Metabolic syndrome Discussed labs with patient today. Triglycerides increased, HDL decreased.  Abdominal circumference >40", improving.  Actively working on diet and exercise. Visceral fat rating coming down.    3. Elevated LFTs Discussed labs with patient today. Improving.  LFT's have improved with  52 lbs of weight loss.   4. Insulin resistance Discussed labs with patient today. Fasting insulin 32.8 on labs.  Actively reducing intake of carbs and sugar.   Assessment/Plan:   1. Vitamin D deficiency Refill - Vitamin D, Ergocalciferol, (DRISDOL) 1.25 MG (50000 UNIT) CAPS capsule; Take 1 capsule (50,000 Units total) by mouth every 7 (seven) days.  Dispense: 4 capsule; Refill: 0  2. Metabolic syndrome Continue diet and exercise.  3. Elevated LFTs Continue diet and exercise.   4. Insulin resistance Recheck fasting insulin  2 months.   5. Obesity, current BMI 33.4 Jonathan Hutchinson is currently in the action stage of change. As such, his goal is to continue with weight  loss efforts. He has agreed to keeping a food journal and adhering to recommended goals of 2200 calories and 120 protein.   Exercise goals:  As is.   Behavioral modification strategies: increasing lean protein intake, increasing vegetables, increasing water intake, decreasing liquid calories, decreasing eating out, no skipping meals, better snacking choices, and decreasing junk food.  Jonathan Hutchinson has agreed to follow-up with our clinic in 4 weeks. He was informed of the importance of frequent follow-up visits to maximize his success with intensive lifestyle modifications for his multiple health conditions.   Objective:   Blood pressure 107/69, pulse 85, temperature 98.1 F (36.7 C), height 5\' 11"  (1.803 m), weight 239 lb (108.4 kg), SpO2 91 %. Body mass index is 33.33 kg/m.  General: Cooperative, alert, well developed, in no acute distress. HEENT: Conjunctivae and lids unremarkable. Cardiovascular: Regular rhythm.  Lungs: Normal work of breathing. Neurologic: No focal deficits.   Lab Results  Component Value Date   CREATININE 1.12 05/20/2022   BUN 18 05/20/2022   NA 142 05/20/2022   K 4.2 05/20/2022   CL 105 05/20/2022   CO2 23 05/20/2022   Lab Results  Component Value Date   ALT 32 05/20/2022   AST 20 05/20/2022   ALKPHOS 60 05/20/2022   BILITOT 0.6 05/20/2022   Lab Results  Component Value Date   HGBA1C 5.1 01/30/2022   Lab Results  Component Value Date   INSULIN 32.8 (H) 01/30/2022   Lab Results  Component Value Date   TSH 2.020 01/30/2022   Lab Results  Component Value Date   CHOL 165 05/20/2022   HDL 37 (L) 05/20/2022   LDLCALC 100 (H) 05/20/2022   LDLDIRECT 69.0 03/28/2022   TRIG 160 (H) 05/20/2022   CHOLHDL 5 03/28/2022   Lab Results  Component Value Date   VD25OH 37.5 05/20/2022   VD25OH 16.8 (L) 01/30/2022   Lab Results  Component Value Date   WBC 8.4 07/24/2021   HGB 16.0 07/24/2021   HCT 46.3 07/24/2021   MCV 86.2 07/24/2021   PLT 233.0  07/24/2021   No results found for: "IRON", "TIBC", "FERRITIN"  Attestation Statements:   Reviewed by clinician on day of visit: allergies, medications, problem list, medical history, surgical history, family history, social history, and previous encounter notes.  I, Jonathan Hutchinson, am acting as Energy manager for Seymour Bars, DO.  I have reviewed the above documentation for accuracy and completeness, and I agree with the above. [name], DO

## 2022-07-10 ENCOUNTER — Encounter (INDEPENDENT_AMBULATORY_CARE_PROVIDER_SITE_OTHER): Payer: Self-pay | Admitting: Family Medicine

## 2022-07-10 ENCOUNTER — Ambulatory Visit (INDEPENDENT_AMBULATORY_CARE_PROVIDER_SITE_OTHER): Payer: 59 | Admitting: Family Medicine

## 2022-07-10 VITALS — BP 127/85 | HR 73 | Temp 98.7°F | Ht 71.0 in | Wt 235.0 lb

## 2022-07-10 DIAGNOSIS — Z6832 Body mass index (BMI) 32.0-32.9, adult: Secondary | ICD-10-CM

## 2022-07-10 DIAGNOSIS — E669 Obesity, unspecified: Secondary | ICD-10-CM | POA: Diagnosis not present

## 2022-07-10 DIAGNOSIS — E88819 Insulin resistance, unspecified: Secondary | ICD-10-CM | POA: Diagnosis not present

## 2022-07-10 DIAGNOSIS — E8881 Metabolic syndrome: Secondary | ICD-10-CM

## 2022-07-10 DIAGNOSIS — E559 Vitamin D deficiency, unspecified: Secondary | ICD-10-CM

## 2022-07-10 MED ORDER — VITAMIN D (ERGOCALCIFEROL) 1.25 MG (50000 UNIT) PO CAPS: 50000.0000 [IU] | ORAL_CAPSULE | ORAL | 0 refills | Status: DC

## 2022-07-11 DIAGNOSIS — E8881 Metabolic syndrome: Secondary | ICD-10-CM | POA: Insufficient documentation

## 2022-07-18 NOTE — Progress Notes (Signed)
Chief Complaint:   OBESITY Jonathan Hutchinson is here to discuss his progress with his obesity treatment plan along with follow-up of his obesity related diagnoses. Jonathan Hutchinson is on keeping a food journal and adhering to recommended goals of 2200 calories and 120 protein and states he is following his eating plan approximately 50% of the time. Jonathan Hutchinson states he is doing cardio and weights 60 minutes 3-4 times per week.  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  He is using my fitness pal app.  Has fruits and veggies daily and plenty of water.  Fiance' is supportive.  Brings food to work and has been consistent with exercise.   Subjective:   1. Vitamin D deficiency He is on prescription Vitamin D weekly.  Last Vitamin D 37.5, which is up from 16.8.   2. Metabolic syndrome 05/20/2022 labs, HDL 37, Triglycerides 160, abdominal circumference 47".   3. Insulin resistance Actively reducing sugar intake.  Fasting insulin level 01/30/2022 was 32.8.  Assessment/Plan:   1. Vitamin D deficiency Recheck Vitamin D level in January 2024.    Refill - Vitamin D, Ergocalciferol, (DRISDOL) 1.25 MG (50000 UNIT) CAPS capsule; Take 1 capsule (50,000 Units total) by mouth every 7 (seven) days.  Dispense: 5 capsule; Refill: 0  2. Metabolic syndrome Recheck FLP in January 2024.  Continue low sugar diet.   3. Insulin resistance Recheck fasting insulin in January 2024.  4. Obesity,current BMI 32.9 Reviewed [employer] results, doing great with body fat loss and maintenance of muscle mass.   Jonathan Hutchinson is currently in the action stage of change. As such, his goal is to continue with weight loss efforts. He has agreed to keeping a food journal and adhering to recommended goals of 2200  calories and 120 protein.   Exercise goals:  As is.   Behavioral modification strategies: increasing vegetables, increasing water intake, meal planning and cooking strategies, ways to avoid boredom eating, better snacking choices, planning for success, and decreasing junk food.  Jonathan Hutchinson has agreed to follow-up with our clinic in 4 weeks. He was informed of the importance of frequent follow-up visits to maximize his success with intensive lifestyle modifications for his multiple health conditions.   Objective:   Blood pressure 127/85, pulse 73, temperature 98.7 F (37.1 C), height 5\' 11"  (1.803 m), weight 235 lb (106.6 kg), SpO2 98 %. Body mass index is 32.78 kg/m.  General: Cooperative, alert, well developed, in no acute distress. HEENT: Conjunctivae and lids unremarkable. Cardiovascular: Regular rhythm.  Lungs: Normal work of breathing. Neurologic: No focal deficits.   Lab Results  Component Value Date   CREATININE 1.12 05/20/2022   BUN 18 05/20/2022   NA 142 05/20/2022   K 4.2 05/20/2022   CL 105 05/20/2022   CO2 23 05/20/2022   Lab Results  Component Value Date   ALT 32 05/20/2022   AST 20 05/20/2022   ALKPHOS 60 05/20/2022   BILITOT 0.6 05/20/2022   Lab Results  Component Value Date   HGBA1C 5.1 01/30/2022   Lab Results  Component Value Date   INSULIN 32.8 (H) 01/30/2022   Lab Results  Component Value Date   TSH 2.020 01/30/2022   Lab Results  Component Value Date   CHOL 165 05/20/2022   HDL  37 (L) 05/20/2022   LDLCALC 100 (H) 05/20/2022   LDLDIRECT 69.0 03/28/2022   TRIG 160 (H) 05/20/2022   CHOLHDL 5 03/28/2022   Lab Results  Component Value Date   VD25OH 37.5 05/20/2022   VD25OH 16.8 (L) 01/30/2022   Lab Results  Component Value Date   WBC 8.4 07/24/2021   HGB 16.0 07/24/2021   HCT 46.3 07/24/2021   MCV 86.2 07/24/2021   PLT 233.0 07/24/2021   No results found for: "IRON", "TIBC", "FERRITIN"  Attestation Statements:   Reviewed by  clinician on day of visit: allergies, medications, problem list, medical history, surgical history, family history, social history, and previous encounter notes.  I, Davy Pique, am acting as Location manager for Loyal Gambler, DO.   I have reviewed the above documentation for accuracy and completeness, and I agree with the above. Dell Ponto, DO

## 2022-08-12 ENCOUNTER — Encounter (INDEPENDENT_AMBULATORY_CARE_PROVIDER_SITE_OTHER): Payer: Self-pay | Admitting: Family Medicine

## 2022-08-12 ENCOUNTER — Ambulatory Visit (INDEPENDENT_AMBULATORY_CARE_PROVIDER_SITE_OTHER): Payer: 59 | Admitting: Family Medicine

## 2022-08-12 VITALS — BP 132/81 | HR 68 | Temp 98.8°F | Ht 71.0 in | Wt 232.0 lb

## 2022-08-12 DIAGNOSIS — E88819 Insulin resistance, unspecified: Secondary | ICD-10-CM

## 2022-08-12 DIAGNOSIS — E559 Vitamin D deficiency, unspecified: Secondary | ICD-10-CM | POA: Diagnosis not present

## 2022-08-12 DIAGNOSIS — E669 Obesity, unspecified: Secondary | ICD-10-CM

## 2022-08-12 DIAGNOSIS — E8881 Metabolic syndrome: Secondary | ICD-10-CM | POA: Diagnosis not present

## 2022-08-12 DIAGNOSIS — Z6832 Body mass index (BMI) 32.0-32.9, adult: Secondary | ICD-10-CM

## 2022-08-12 MED ORDER — VITAMIN D (ERGOCALCIFEROL) 1.25 MG (50000 UNIT) PO CAPS: 50000.0000 [IU] | ORAL_CAPSULE | ORAL | 0 refills | Status: DC

## 2022-08-21 ENCOUNTER — Encounter: Payer: Self-pay | Admitting: Family Medicine

## 2022-08-21 ENCOUNTER — Ambulatory Visit (INDEPENDENT_AMBULATORY_CARE_PROVIDER_SITE_OTHER): Payer: 59 | Admitting: Family Medicine

## 2022-08-21 VITALS — BP 118/84 | HR 68 | Temp 97.9°F | Ht 71.0 in | Wt 235.4 lb

## 2022-08-21 DIAGNOSIS — Z0000 Encounter for general adult medical examination without abnormal findings: Secondary | ICD-10-CM | POA: Diagnosis not present

## 2022-08-21 DIAGNOSIS — E785 Hyperlipidemia, unspecified: Secondary | ICD-10-CM

## 2022-08-21 NOTE — Patient Instructions (Signed)
Give us 2-3 business days to get the results of your labs back.   Keep the diet clean and stay active.  Do monthly self testicular checks in the shower. You are feeling for lumps/bumps that don't belong. If you feel anything like this, let me know!  Please get me a copy of your advanced directive form at your convenience.   Let us know if you need anything.  

## 2022-08-21 NOTE — Progress Notes (Signed)
Chief Complaint  Patient presents with   Annual Exam    Well Male Jonathan Hutchinson is here for a complete physical.   His last physical was >1 year ago.  Current diet: in general, a "healthy" diet.   Current exercise: cardio, lifting wts Weight trend: intentionally decreasing Fatigue out of ordinary? No. Seat belt? Yes.   Advanced directive? No  Health maintenance Tetanus- Yes HIV- Yes Hep C- Yes  Past Medical History:  Diagnosis Date   Back pain    High blood pressure    Joint pain    Mixed hyperlipidemia    Ruptured disk    Sleep apnea      Past Surgical History:  Procedure Laterality Date   KNEE SURGERY Right    testical removal     Removed Left    Medications  Current Outpatient Medications on File Prior to Visit  Medication Sig Dispense Refill   rosuvastatin (CRESTOR) 10 MG tablet Take 1 tablet (10 mg total) by mouth daily. 90 tablet 3   Vitamin D, Ergocalciferol, (DRISDOL) 1.25 MG (50000 UNIT) CAPS capsule Take 1 capsule (50,000 Units total) by mouth every 7 (seven) days. 5 capsule 0   gabapentin (NEURONTIN) 300 MG capsule Take by mouth. (Patient not taking: Reported on 08/21/2022)     No current facility-administered medications on file prior to visit.     Allergies Allergies  Allergen Reactions   Diclofenac Swelling    Caused tongue swelling     Family History Family History  Problem Relation Age of Onset   Hypertension Father    Hyperlipidemia Father     Review of Systems: Constitutional: no fevers or chills Eye:  no recent significant change in vision Ear/Nose/Mouth/Throat:  Ears:  no hearing loss Nose/Mouth/Throat:  no complaints of nasal congestion, no sore throat Cardiovascular:  no chest pain Respiratory:  no shortness of breath Gastrointestinal:  no abdominal pain, no change in bowel habits GU:  Male: negative for dysuria Musculoskeletal/Extremities:  no pain of the joints Integumentary (Skin/Breast):  no abnormal skin lesions  reported Neurologic:  no headaches Endocrine: No unexpected weight changes Hematologic/Lymphatic:  no night sweats  Exam BP 118/84 (BP Location: Left Arm, Patient Position: Sitting, Cuff Size: Normal)   Pulse 68   Temp 97.9 F (36.6 C) (Oral)   Ht 5\' 11"  (1.803 m)   Wt 235 lb 6 oz (106.8 kg)   PF 97 L/min   BMI 32.83 kg/m  General:  well developed, well nourished, in no apparent distress Skin:  no significant moles, warts, or growths Head:  no masses, lesions, or tenderness Eyes:  pupils equal and round, sclera anicteric without injection Ears:  canals without lesions, TMs shiny without retraction, no obvious effusion, no erythema Nose:  nares patent, mucosa normal Throat/Pharynx:  lips and gingiva without lesion; tongue and uvula midline; non-inflamed pharynx; no exudates or postnasal drainage Neck: neck supple without adenopathy, thyromegaly, or masses Lungs:  clear to auscultation, breath sounds equal bilaterally, no respiratory distress Cardio:  regular rate and rhythm, no bruits, no LE edema Abdomen:  abdomen soft, nontender; bowel sounds normal; no masses or organomegaly Genital (male): Deferred Rectal: Deferred Musculoskeletal:  symmetrical muscle groups noted without atrophy or deformity Extremities:  no clubbing, cyanosis, or edema, no deformities, no skin discoloration Neuro:  gait normal; deep tendon reflexes normal and symmetric Psych: well oriented with normal range of affect and appropriate judgment/insight  Assessment and Plan  Well adult exam - Plan: CBC, Comprehensive metabolic panel, Lipid  panel   Well 36 y.o. male. Counseled on diet and exercise. Self testicular exams recommended at least monthly.  Advanced directive form provided today.  Other orders as above. Follow up in 6 mo pending the above workup. The patient voiced understanding and agreement to the plan.  Jilda Roche [place], DO 08/21/22 1:50 PM

## 2022-08-22 ENCOUNTER — Other Ambulatory Visit: Payer: Self-pay | Admitting: Family Medicine

## 2022-08-22 LAB — COMPREHENSIVE METABOLIC PANEL
ALT: 19 U/L (ref 0–53)
AST: 19 U/L (ref 0–37)
Albumin: 4.7 g/dL (ref 3.5–5.2)
Alkaline Phosphatase: 50 U/L (ref 39–117)
BUN: 14 mg/dL (ref 6–23)
CO2: 28 mEq/L (ref 19–32)
Calcium: 9 mg/dL (ref 8.4–10.5)
Chloride: 106 mEq/L (ref 96–112)
Creatinine, Ser: 0.79 mg/dL (ref 0.40–1.50)
GFR: 114.01 mL/min (ref >60.00)
Glucose, Bld: 75 mg/dL (ref 70–99)
Potassium: 3.7 mEq/L (ref 3.5–5.1)
Sodium: 141 mEq/L (ref 135–145)
Total Bilirubin: 1 mg/dL (ref 0.2–1.2)
Total Protein: 6.8 g/dL (ref 6.0–8.3)

## 2022-08-22 LAB — LIPID PANEL
Cholesterol: 134 mg/dL (ref 0–200)
HDL: 36 mg/dL — ABNORMAL LOW (ref >39.00)
LDL Cholesterol: 72 mg/dL (ref 0–99)
NonHDL: 97.67
Total CHOL/HDL Ratio: 4
Triglycerides: 128 mg/dL (ref 0.0–149.0)
VLDL: 25.6 mg/dL (ref 0.0–40.0)

## 2022-08-22 LAB — CBC
HCT: 45.6 % (ref 39.0–52.0)
Hemoglobin: 15.5 g/dL (ref 13.0–17.0)
MCHC: 33.9 g/dL (ref 30.0–36.0)
MCV: 88.2 fl (ref 78.0–100.0)
Platelets: 228 10*3/uL (ref 150.0–400.0)
RBC: 5.17 Mil/uL (ref 4.22–5.81)
RDW: 13.9 % (ref 11.5–15.5)
WBC: 7.3 10*3/uL (ref 4.0–10.5)

## 2022-08-22 NOTE — Progress Notes (Signed)
Chief Complaint:   OBESITY Jonathan Hutchinson is here to discuss his progress with his obesity treatment plan along with follow-up of his obesity related diagnoses. Jonathan Hutchinson is on keeping a food journal and adhering to recommended goals of 2200 calories and 120 protein and states he is following his eating plan approximately 95% of the time. Jonathan Hutchinson states he is Cardio 30-60 minutes 4 times per week.  Today's visit was #: 9 Starting weight: 291 lbs Starting date: 01/30/2022 Today's weight: 232 lbs Today's date: 08/12/2022 Total lbs lost to date: 59 lbs Total lbs lost since last in-office visit: 3 lbs  Interim History:  Gym time limited due to having a trainee at work.  Going to [place] after Thanksgiving.  Eating out less often.  Doing better with logging, meal planning and bringing food to work.  Denies hunger or cravings.    Subjective:   1. Vitamin D deficiency He is currently taking prescription vitamin D 50,000 IU each week. He denies nausea, vomiting or muscle weakness. Last Vitamin D level 05/20/22 was 37.5, improved from 16.8.  2. Insulin resistance Fasting insulin 32.8.  She has greatly reduced intake of sugar and starch.   3. Metabolic syndrome Anticipate improvements given 12.7% TBW loss since 1st visit 6 months ago.  Losing Visceral body fat and inches on abdominal circumference.   Assessment/Plan:   1. Vitamin D deficiency Refill - Vitamin D, Ergocalciferol, (DRISDOL) 1.25 MG (50000 UNIT) CAPS capsule; Take 1 capsule (50,000 Units total) by mouth every 7 (seven) days.  Dispense: 5 capsule; Refill: 0  2. Insulin resistance Ramp up workouts to 3 times per week.  Recheck fasting insulin in January.   3. Metabolic syndrome Plan to have FLP rechecked at upcoming CPE.   4. Obesity,current BMI 32.4 Reviewed body fat, has decreased 25%, she has been maintaining muscle mass.   Jonathan Hutchinson is currently in the action stage of change. As such, his goal is to continue with weight loss  efforts. He has agreed to keeping a food journal and adhering to recommended goals of 2200 calories and 160 protein.   Exercise goals:  Try the gym 3 times per week.   Behavioral modification strategies: increasing lean protein intake, increasing vegetables, increasing water intake, decreasing eating out, no skipping meals, meal planning and cooking strategies, keeping healthy foods in the home, avoiding temptations, planning for success, and decreasing junk food.  Jonathan Hutchinson has agreed to follow-up with our clinic in 4 weeks. He was informed of the importance of frequent follow-up visits to maximize his success with intensive lifestyle modifications for his multiple health conditions.   Objective:   Blood pressure 132/81, pulse 68, temperature 98.8 F (37.1 C), height 5\' 11"  (1.803 m), weight 232 lb (105.2 kg), SpO2 100 %. Body mass index is 32.36 kg/m.  General: Cooperative, alert, well developed, in no acute distress. HEENT: Conjunctivae and lids unremarkable. Cardiovascular: Regular rhythm.  Lungs: Normal work of breathing. Neurologic: No focal deficits.   Lab Results  Component Value Date   CREATININE 1.12 05/20/2022   BUN 18 05/20/2022   NA 142 05/20/2022   K 4.2 05/20/2022   CL 105 05/20/2022   CO2 23 05/20/2022   Lab Results  Component Value Date   ALT 32 05/20/2022   AST 20 05/20/2022   ALKPHOS 60 05/20/2022   BILITOT 0.6 05/20/2022   Lab Results  Component Value Date   HGBA1C 5.1 01/30/2022   Lab Results  Component Value Date   INSULIN 32.8 (  H) 01/30/2022   Lab Results  Component Value Date   TSH 2.020 01/30/2022   Lab Results  Component Value Date   CHOL 165 05/20/2022   HDL 37 (L) 05/20/2022   LDLCALC 100 (H) 05/20/2022   LDLDIRECT 69.0 03/28/2022   TRIG 160 (H) 05/20/2022   CHOLHDL 5 03/28/2022   Lab Results  Component Value Date   VD25OH 37.5 05/20/2022   VD25OH 16.8 (L) 01/30/2022   Lab Results  Component Value Date   WBC 8.4 07/24/2021    HGB 16.0 07/24/2021   HCT 46.3 07/24/2021   MCV 86.2 07/24/2021   PLT 233.0 07/24/2021   No results found for: "IRON", "TIBC", "FERRITIN"  Attestation Statements:   Reviewed by clinician on day of visit: allergies, medications, problem list, medical history, surgical history, family history, social history, and previous encounter notes.  I, Jonathan Hutchinson, am acting as Energy manager for Jonathan Bars, DO.  I have reviewed the above documentation for accuracy and completeness, and I agree with the above. Jonathan Brink, DO

## 2022-09-10 ENCOUNTER — Encounter (INDEPENDENT_AMBULATORY_CARE_PROVIDER_SITE_OTHER): Payer: Self-pay | Admitting: Family Medicine

## 2022-09-10 ENCOUNTER — Ambulatory Visit (INDEPENDENT_AMBULATORY_CARE_PROVIDER_SITE_OTHER): Payer: 59 | Admitting: Family Medicine

## 2022-09-10 VITALS — BP 135/89 | HR 97 | Temp 98.6°F | Ht 71.0 in | Wt 239.0 lb

## 2022-09-10 DIAGNOSIS — E668 Other obesity: Secondary | ICD-10-CM | POA: Diagnosis not present

## 2022-09-10 DIAGNOSIS — E7849 Other hyperlipidemia: Secondary | ICD-10-CM | POA: Diagnosis not present

## 2022-09-10 DIAGNOSIS — Z6833 Body mass index (BMI) 33.0-33.9, adult: Secondary | ICD-10-CM

## 2022-09-10 DIAGNOSIS — E559 Vitamin D deficiency, unspecified: Secondary | ICD-10-CM

## 2022-09-10 DIAGNOSIS — E65 Localized adiposity: Secondary | ICD-10-CM

## 2022-09-10 MED ORDER — VITAMIN D (ERGOCALCIFEROL) 1.25 MG (50000 UNIT) PO CAPS: 50000.0000 [IU] | ORAL_CAPSULE | ORAL | 0 refills | Status: DC

## 2022-09-18 NOTE — Progress Notes (Signed)
Chief Complaint:   OBESITY Jonathan Hutchinson is here to discuss his progress with his obesity treatment plan along with follow-up of his obesity related diagnoses. Napolean is on keeping a food journal and adhering to recommended goals of 2200 calories and 160 protein and states he is following his eating plan approximately 50% of the time. Eden states he is cardio 60 minutes 2-3 times per week.  Today's visit was #: 10 Starting weight: 291 LBS Starting date: 01/30/2022 Today's weight: 239 LBS Today's date: 09/10/2022 Total lbs lost to date: 52 LBS Total lbs lost since last in-office visit: +7 LBS  Interim History: Patient traveled to [place] for Thanksgiving with meals out and eating off plan.  He is trying to get back on track with eating plan and plan for log.  He is getting back to the gym 2-3 times per week.  Subjective:   1. Other hyperlipidemia FLP reviewed from 08/21/2022, total cholesterol 134, triglycerides 128, HDL 36, LDL 72.  PCP has stopped his Crestor and will recheck FLP in late December.  2. Vitamin D deficiency Patient is on prescription vitamin D 50,000 IU weekly.  Vitamin D level was 37.5 on 05/20/2022, it is up from 16.8.  3. Visceral obesity Visceral fat rating is 12 up from 11 last month with getting off plan due to travel and holidays.  He started with visceral fat rating of 19.  Has a TBW loss of 17.8% in 7 months.  Assessment/Plan:   1. Other hyperlipidemia Continue heart healthy diet, regular exercise and weight loss plan.  2. Vitamin D deficiency Recheck level, labs ordered to be drawn 10/02/2022.  Continue prescription if  >50. - Vitamin D, Ergocalciferol, (DRISDOL) 1.25 MG (50000 UNIT) CAPS capsule; Take 1 capsule (50,000 Units total) by mouth every 7 (seven) days.  Dispense: 5 capsule; Refill: 0 - Vitamin D, 25-Hydroxy, Total; Future  3. Visceral obesity Will track for further decrease in visceral fat rating with the creation of a calorie deficit.  4.  Obesity,current BMI 33.3 1.  Resume dietary log. 2.  Increase gym to 3 times a week consistently.  Jonathan Hutchinson is currently in the action stage of change. As such, his goal is to get back to weightloss efforts . He has agreed to keeping a food journal and adhering to recommended goals of 2200 calories and 160 protein.   Exercise goals:  Increase gym to 3 times per week.  Behavioral modification strategies: increasing lean protein intake, increasing vegetables, increasing water intake, decreasing eating out, no skipping meals, meal planning and cooking strategies, holiday eating strategies , and planning for success.  Jonathan Hutchinson has agreed to follow-up with our clinic in 6-7 weeks. He was informed of the importance of frequent follow-up visits to maximize his success with intensive lifestyle modifications for his multiple health conditions.   Objective:   Blood pressure 135/89, pulse 97, temperature 98.6 F (37 C), height 5\' 11"  (1.803 m), weight 239 lb (108.4 kg), SpO2 95 %. Body mass index is 33.33 kg/m.  General: Cooperative, alert, well developed, in no acute distress. HEENT: Conjunctivae and lids unremarkable. Cardiovascular: Regular rhythm.  Lungs: Normal work of breathing. Neurologic: No focal deficits.   Lab Results  Component Value Date   CREATININE 0.79 08/21/2022   BUN 14 08/21/2022   NA 141 08/21/2022   K 3.7 08/21/2022   CL 106 08/21/2022   CO2 28 08/21/2022   Lab Results  Component Value Date   ALT 19 08/21/2022   AST  19 08/21/2022   ALKPHOS 50 08/21/2022   BILITOT 1.0 08/21/2022   Lab Results  Component Value Date   HGBA1C 5.1 01/30/2022   Lab Results  Component Value Date   INSULIN 32.8 (H) 01/30/2022   Lab Results  Component Value Date   TSH 2.020 01/30/2022   Lab Results  Component Value Date   CHOL 134 08/21/2022   HDL 36.00 (L) 08/21/2022   LDLCALC 72 08/21/2022   LDLDIRECT 69.0 03/28/2022   TRIG 128.0 08/21/2022   CHOLHDL 4 08/21/2022   Lab  Results  Component Value Date   VD25OH 37.5 05/20/2022   VD25OH 16.8 (L) 01/30/2022   Lab Results  Component Value Date   WBC 7.3 08/21/2022   HGB 15.5 08/21/2022   HCT 45.6 08/21/2022   MCV 88.2 08/21/2022   PLT 228.0 08/21/2022   No results found for: "IRON", "TIBC", "FERRITIN"  Attestation Statements:   Reviewed by clinician on day of visit: allergies, medications, problem list, medical history, surgical history, family history, social history, and previous encounter notes.  I, Malcolm Metro, am acting as Energy manager for Seymour Bars, DO.  I have reviewed the above documentation for accuracy and completeness, and I agree with the above. [name], DO

## 2022-10-02 ENCOUNTER — Other Ambulatory Visit (INDEPENDENT_AMBULATORY_CARE_PROVIDER_SITE_OTHER): Payer: 59

## 2022-10-02 DIAGNOSIS — E7849 Other hyperlipidemia: Secondary | ICD-10-CM | POA: Diagnosis not present

## 2022-10-02 DIAGNOSIS — E559 Vitamin D deficiency, unspecified: Secondary | ICD-10-CM | POA: Diagnosis not present

## 2022-10-02 LAB — VITAMIN D 25 HYDROXY (VIT D DEFICIENCY, FRACTURES): VITD: 24.95 ng/mL — ABNORMAL LOW (ref 30.00–100.00)

## 2022-10-02 LAB — LIPID PANEL
Cholesterol: 225 mg/dL — ABNORMAL HIGH (ref 0–200)
HDL: 40.1 mg/dL (ref >39.00)
NonHDL: 184.93
Total CHOL/HDL Ratio: 6
Triglycerides: 365 mg/dL — ABNORMAL HIGH (ref 0.0–149.0)
VLDL: 73 mg/dL — ABNORMAL HIGH (ref 0.0–40.0)

## 2022-10-02 LAB — LDL CHOLESTEROL, DIRECT: Direct LDL: 113 mg/dL

## 2022-10-28 ENCOUNTER — Ambulatory Visit (INDEPENDENT_AMBULATORY_CARE_PROVIDER_SITE_OTHER): Payer: 59 | Admitting: Family Medicine

## 2022-10-28 ENCOUNTER — Encounter (INDEPENDENT_AMBULATORY_CARE_PROVIDER_SITE_OTHER): Payer: Self-pay | Admitting: Family Medicine

## 2022-10-28 VITALS — BP 137/91 | HR 91 | Temp 98.5°F | Ht 71.0 in | Wt 242.0 lb

## 2022-10-28 DIAGNOSIS — E669 Obesity, unspecified: Secondary | ICD-10-CM | POA: Diagnosis not present

## 2022-10-28 DIAGNOSIS — E559 Vitamin D deficiency, unspecified: Secondary | ICD-10-CM | POA: Diagnosis not present

## 2022-10-28 DIAGNOSIS — E88819 Insulin resistance, unspecified: Secondary | ICD-10-CM | POA: Diagnosis not present

## 2022-10-28 DIAGNOSIS — Z6833 Body mass index (BMI) 33.0-33.9, adult: Secondary | ICD-10-CM

## 2022-10-28 MED ORDER — VITAMIN D (ERGOCALCIFEROL) 1.25 MG (50000 UNIT) PO CAPS: 50000.0000 [IU] | ORAL_CAPSULE | ORAL | 0 refills | Status: DC

## 2022-11-15 NOTE — Progress Notes (Signed)
Chief Complaint:   OBESITY Dona is here to discuss his progress with his obesity treatment plan along with follow-up of his obesity related diagnoses. Lansana is on keeping a food journal and adhering to recommended goals of 2200 calories and 160 grams protein and states he is following his eating plan approximately 70% of the time. Klayten states he is not currently exercising.  Today's visit was #: 11 Starting weight: 291 lbs Starting date: 01/30/2022 Today's weight: 242 lbs Today's date: 10/28/2022 Total lbs lost to date: 49 Total lbs lost since last in-office visit: +3  Interim History: Cyress has been more sedentary the last 2 weeks. He plans to resume gym workouts, doing cardio and weight training. He also plans to start logging. Pt was snacking more. He is dealing with cervical disc pain. His fiance also fell off track with eating healthy.  Subjective:   1. Vitamin D deficiency On 10/02/2022, Bryam's Vitamin D was 24.9. He is not currently taking a Vitamin D supplement. We discussed the problems Vitamin deficiency has on energy, immune function, bone health, and metabolic health.  2. Insulin resistance Tyray's A1c on 01/30/2022 was 5.1 with an insulin level of 32.8. He has lost 49 lbs since then, which is a 16.8% total body weight loss.  He has reduced his sugar intake.  Assessment/Plan:   1. Vitamin D deficiency Begin prescription Vitamin D 50,000 IU weekly.  Start- Vitamin D, Ergocalciferol, (DRISDOL) 1.25 MG (50000 UNIT) CAPS capsule; Take 1 capsule (50,000 Units total) by mouth every 7 (seven) days.  Dispense: 12 capsule; Refill: 0  2. Insulin resistance We discussed the importance of dietary consistency and regular exercise. Recheck fasting insulin in 3 months.  3. Obesity,current BMI 33.9 Resume gym workouts. Resume dieting logging.  Raanan is currently in the action stage of change. As such, his goal is to continue with weight loss efforts. He has agreed to  keeping a food journal and adhering to recommended goals of 2200 calories and 150 grams protein.   Exercise goals:  Go to the gym 3 times a week and track daily steps.  Behavioral modification strategies: increasing lean protein intake, increasing water intake, decreasing liquid calories, decreasing alcohol intake, decreasing eating out, meal planning and cooking strategies, keeping healthy foods in the home, better snacking choices, keeping a strict food journal, and decreasing junk food.  Deniro has agreed to follow-up with our clinic in 4 weeks. He was informed of the importance of frequent follow-up visits to maximize his success with intensive lifestyle modifications for his multiple health conditions.   Objective:   Blood pressure (!) 137/91, pulse 91, temperature 98.5 F (36.9 C), height 5' 11"$  (1.803 m), weight 242 lb (109.8 kg), SpO2 97 %. Body mass index is 33.75 kg/m.  General: Cooperative, alert, well developed, in no acute distress. HEENT: Conjunctivae and lids unremarkable. Cardiovascular: Regular rhythm.  Lungs: Normal work of breathing. Neurologic: No focal deficits.   Lab Results  Component Value Date   CREATININE 0.79 08/21/2022   BUN 14 08/21/2022   NA 141 08/21/2022   K 3.7 08/21/2022   CL 106 08/21/2022   CO2 28 08/21/2022   Lab Results  Component Value Date   ALT 19 08/21/2022   AST 19 08/21/2022   ALKPHOS 50 08/21/2022   BILITOT 1.0 08/21/2022   Lab Results  Component Value Date   HGBA1C 5.1 01/30/2022   Lab Results  Component Value Date   INSULIN 32.8 (H) 01/30/2022   Lab  Results  Component Value Date   TSH 2.020 01/30/2022   Lab Results  Component Value Date   CHOL 225 (H) 10/02/2022   HDL 40.10 10/02/2022   LDLCALC 72 08/21/2022   LDLDIRECT 113.0 10/02/2022   TRIG 365.0 (H) 10/02/2022   CHOLHDL 6 10/02/2022   Lab Results  Component Value Date   VD25OH 24.95 (L) 10/02/2022   VD25OH 37.5 05/20/2022   VD25OH 16.8 (L) 01/30/2022    Lab Results  Component Value Date   WBC 7.3 08/21/2022   HGB 15.5 08/21/2022   HCT 45.6 08/21/2022   MCV 88.2 08/21/2022   PLT 228.0 08/21/2022   Attestation Statements:   Reviewed by clinician on day of visit: allergies, medications, problem list, medical history, surgical history, family history, social history, and previous encounter notes.  I have personally spent 30 minutes total time today in preparation, patient care, nutritional counseling and documentation for this visit, including the following: review of clinical lab tests; review of medical tests/procedures/services.    I, Kathlene November, BS, CMA, am acting as transcriptionist for Loyal Gambler, DO.   I have reviewed the above documentation for accuracy and completeness, and I agree with the above. Dell Ponto, DO

## 2022-12-02 ENCOUNTER — Ambulatory Visit (INDEPENDENT_AMBULATORY_CARE_PROVIDER_SITE_OTHER): Payer: 59 | Admitting: Family Medicine

## 2022-12-02 ENCOUNTER — Encounter (INDEPENDENT_AMBULATORY_CARE_PROVIDER_SITE_OTHER): Payer: Self-pay | Admitting: Family Medicine

## 2022-12-02 VITALS — BP 138/92 | HR 72 | Temp 98.0°F | Ht 71.0 in | Wt 243.0 lb

## 2022-12-02 DIAGNOSIS — E88819 Insulin resistance, unspecified: Secondary | ICD-10-CM | POA: Diagnosis not present

## 2022-12-02 DIAGNOSIS — Z6833 Body mass index (BMI) 33.0-33.9, adult: Secondary | ICD-10-CM

## 2022-12-02 DIAGNOSIS — R030 Elevated blood-pressure reading, without diagnosis of hypertension: Secondary | ICD-10-CM

## 2022-12-02 DIAGNOSIS — E559 Vitamin D deficiency, unspecified: Secondary | ICD-10-CM | POA: Diagnosis not present

## 2022-12-02 MED ORDER — VITAMIN D (ERGOCALCIFEROL) 1.25 MG (50000 UNIT) PO CAPS: 50000.0000 [IU] | ORAL_CAPSULE | ORAL | 0 refills | Status: DC

## 2022-12-02 NOTE — Progress Notes (Signed)
Office: [phone]  /  Fax: [phone]  WEIGHT SUMMARY AND BIOMETRICS  Vitals Temp: 29 F (36.7 C) BP: (!) 138/92 Pulse Rate: 72 SpO2: 97 %   Anthropometric Measurements Height: '5\' 11"'$  (1.803 m) Weight: 243 lb (110.2 kg) BMI (Calculated): 33.91 Weight at Last Visit: 242lb Weight Lost Since Last Visit: +1 Starting Weight: 291lb Total Weight Loss (lbs): 48 lb (21.8 kg)   Body Composition  Body Fat %: 28.2 % Fat Mass (lbs): 68.6 lbs Muscle Mass (lbs): 166.2 lbs Total Body Water (lbs): 121.8 lbs Visceral Fat Rating : 13   Other Clinical Data Fasting: yes Labs: no Today's Visit #: 12 Starting Date: 01/30/22     HPI  Chief Complaint: OBESITY  Jonathan Hutchinson is here to discuss his progress with his obesity treatment plan. He is on the the Category 4 Plan and states he is following his eating plan approximately 90 % of the time. He states he is exercising 60 minutes 3 times per week.   Interval History:  Since last office visit he is down lbs Doing some VR workouts Has a treadmill at work but hasn't been using  Tracking steps but is not following Not tracking calorie intake Net weight loss 48 lb in 10 mos His progress is 16.5% total body weight loss in 10 months of medically supervised weight management  Pharmacotherapy: None  PHYSICAL EXAM:  Blood pressure (!) 138/92, pulse 72, temperature 98 F (36.7 C), height '5\' 11"'$  (1.803 m), weight 243 lb (110.2 kg), SpO2 97 %. Body mass index is 33.89 kg/m.  General: He is overweight, cooperative, alert, well developed, and in no acute distress. PSYCH: Has normal mood, affect and thought process.   HEENT: EOMI, sclerae are anicteric. Lungs: Normal breathing effort, no conversational dyspnea. Extremities: No edema.  Neurologic: No gross sensory or motor deficits. No tremors or fasciculations noted.    DIAGNOSTIC DATA REVIEWED:  BMET    Component Value Date/Time   NA 141 08/21/2022 1356   NA 142 05/20/2022  0859   K 3.7 08/21/2022 1356   CL 106 08/21/2022 1356   CO2 28 08/21/2022 1356   GLUCOSE 75 08/21/2022 1356   BUN 14 08/21/2022 1356   BUN 18 05/20/2022 0859   CREATININE 0.79 08/21/2022 1356   CREATININE 0.87 06/23/2020 1011   CALCIUM 9.0 08/21/2022 1356   Lab Results  Component Value Date   HGBA1C 5.1 01/30/2022   Lab Results  Component Value Date   INSULIN 32.8 (H) 01/30/2022   Lab Results  Component Value Date   TSH 2.020 01/30/2022   CBC    Component Value Date/Time   WBC 7.3 08/21/2022 1356   RBC 5.17 08/21/2022 1356   HGB 15.5 08/21/2022 1356   HCT 45.6 08/21/2022 1356   PLT 228.0 08/21/2022 1356   MCV 88.2 08/21/2022 1356   MCHC 33.9 08/21/2022 1356   RDW 13.9 08/21/2022 1356   Iron Studies No results found for: "IRON", "TIBC", "FERRITIN", "IRONPCTSAT" Lipid Panel     Component Value Date/Time   CHOL 225 (H) 10/02/2022 0924   CHOL 165 05/20/2022 0859   TRIG 365.0 (H) 10/02/2022 0924   HDL 40.10 10/02/2022 0924   HDL 37 (L) 05/20/2022 0859   CHOLHDL 6 10/02/2022 0924   VLDL 73.0 (H) 10/02/2022 0924   LDLCALC 72 08/21/2022 1356   LDLCALC 100 (H) 05/20/2022 0859   LDLCALC  08/07/2020 0850     Comment:     . LDL cholesterol not calculated. Triglyceride levels  greater than 400 mg/dL invalidate calculated LDL results. . Reference range: <100 . Desirable range <100 mg/dL for primary prevention;   <70 mg/dL for patients with CHD or diabetic patients  with > or = 2 CHD risk factors. Marland Kitchen LDL-C is now calculated using the Martin-Hopkins  calculation, which is a validated novel method providing  better accuracy than the Friedewald equation in the  estimation of LDL-C.  Cresenciano Genre et al. Annamaria Helling. [id number]): 2061-2068  (http://education.[facility].com/faq/FAQ164)    LDLDIRECT 113.0 10/02/2022 0924   Hepatic Function Panel     Component Value Date/Time   PROT 6.8 08/21/2022 1356   PROT 6.5 05/20/2022 0859   ALBUMIN 4.7 08/21/2022 1356   ALBUMIN  4.6 05/20/2022 0859   AST 19 08/21/2022 1356   ALT 19 08/21/2022 1356   ALKPHOS 50 08/21/2022 1356   BILITOT 1.0 08/21/2022 1356   BILITOT 0.6 05/20/2022 0859   BILIDIR 0.2 03/28/2022 0804   IBILI 0.6 08/07/2020 0850      Component Value Date/Time   TSH 2.020 01/30/2022 0939   Nutritional Lab Results  Component Value Date   VD25OH 24.95 (L) 10/02/2022   VD25OH 37.5 05/20/2022   VD25OH 16.8 (L) 01/30/2022     ASSESSMENT AND PLAN  TREATMENT PLAN FOR OBESITY:  Recommended Dietary Goals  Jonathan Hutchinson is currently in the action stage of change. As such, his goal is to continue weight management plan. He has agreed to keeping a food journal and adhering to recommended goals of 2000 calories and 130 g protein. This calorie goal was reduced by 200 cal/day Behavioral Intervention  We discussed the following Behavioral Modification Strategies today: increasing lean protein intake, increasing vegetables, avoiding skipping meals, increasing water intake, work on meal planning and easy cooking plans, work on managing stress, creating time for self-care and relaxation measures, and ways to avoid boredom eating.  Additional resources provided today: NA  Recommended Physical Activity Goals  Jonathan Hutchinson has been advised to work up to 150 minutes of moderate intensity aerobic activity a week and strengthening exercises 2-3 times per week for cardiovascular health, weight loss maintenance and preservation of muscle mass.   He has agreed to Will begin resistance exercise 30 minutes, 3 times per week. Chosen activity weight training and treadmill at work. and Patient also encouraged on scheduling and tracking physical activity.  Track daily steps with a goal of 10,000/day   Pharmacotherapy We discussed various medication options to help Jonathan Hutchinson with his weight loss efforts and we both agreed to none.  ASSOCIATED CONDITIONS ADDRESSED TODAY  Vitamin D deficiency Assessment & Plan: Energy level is  improving on vitamin D 50,000 IU once weekly.  His last vitamin D level was 24.9 10/02/2022.  Target vitamin D level 50-70.  Recheck vitamin D level at next visit in 1 to 2 months.  Orders: -     Vitamin D (Ergocalciferol); Take 1 capsule (50,000 Units total) by mouth every 7 (seven) days.  Dispense: 12 capsule; Refill: 0  Morbid obesity (HCC)  BMI 33.0-33.9,adult  Insulin resistance Assessment & Plan: Fasting insulin elevated 32.8 at the start of our program April 2023.  Since then, he has lost 48 pounds and reduced his intake of added sugar and refined carbohydrates.  He does plan increased physical activity which will help with insulin resistance.  Recheck fasting insulin level in 1 to 2 months.   Elevated blood pressure reading Assessment & Plan: Blood pressure was slightly elevated today with a diastolic of 92.  He denies headache,  chest pain or use of cold medication.  He is not on any medication for hypertension and previous blood pressure readings have been good.  He has increased his intake of prepackaged frozen meals.  We discussed reducing his sodium intake, continue to work on weight reduction and regular exercise.  Will monitor his blood pressure at his next visit.       No follow-ups on file.Marland Kitchen He was informed of the importance of frequent follow up visits to maximize his success with intensive lifestyle modifications for his multiple health conditions.   ATTESTASTION STATEMENTS:  Reviewed by clinician on day of visit: allergies, medications, problem list, medical history, surgical history, family history, social history, and previous encounter notes.   I have personally spent 30 minutes total time today in preparation, patient care, nutritional counseling and documentation for this visit, including the following: review of clinical lab tests; review of medical tests/procedures/services.      Dell Ponto, DO

## 2022-12-02 NOTE — Assessment & Plan Note (Signed)
Blood pressure was slightly elevated today with a diastolic of 92.  He denies headache, chest pain or use of cold medication.  He is not on any medication for hypertension and previous blood pressure readings have been good.  He has increased his intake of prepackaged frozen meals.  We discussed reducing his sodium intake, continue to work on weight reduction and regular exercise.  Will monitor his blood pressure at his next visit.

## 2022-12-02 NOTE — Assessment & Plan Note (Signed)
Fasting insulin elevated 32.8 at the start of our program April 2023.  Since then, he has lost 48 pounds and reduced his intake of added sugar and refined carbohydrates.  He does plan increased physical activity which will help with insulin resistance.  Recheck fasting insulin level in 1 to 2 months.

## 2022-12-02 NOTE — Assessment & Plan Note (Signed)
Energy level is improving on vitamin D 50,000 IU once weekly.  His last vitamin D level was 24.9 10/02/2022.  Target vitamin D level 50-70.  Recheck vitamin D level at next visit in 1 to 2 months.

## 2023-01-09 ENCOUNTER — Encounter (INDEPENDENT_AMBULATORY_CARE_PROVIDER_SITE_OTHER): Payer: Self-pay | Admitting: Family Medicine

## 2023-01-09 ENCOUNTER — Ambulatory Visit (INDEPENDENT_AMBULATORY_CARE_PROVIDER_SITE_OTHER): Payer: 59 | Admitting: Family Medicine

## 2023-01-09 VITALS — BP 126/75 | HR 97 | Temp 97.4°F | Ht 71.0 in | Wt 243.0 lb

## 2023-01-09 DIAGNOSIS — E538 Deficiency of other specified B group vitamins: Secondary | ICD-10-CM | POA: Diagnosis not present

## 2023-01-09 DIAGNOSIS — Z6833 Body mass index (BMI) 33.0-33.9, adult: Secondary | ICD-10-CM | POA: Insufficient documentation

## 2023-01-09 DIAGNOSIS — E88819 Insulin resistance, unspecified: Secondary | ICD-10-CM | POA: Diagnosis not present

## 2023-01-09 DIAGNOSIS — E669 Obesity, unspecified: Secondary | ICD-10-CM

## 2023-01-09 DIAGNOSIS — E782 Mixed hyperlipidemia: Secondary | ICD-10-CM

## 2023-01-09 DIAGNOSIS — E559 Vitamin D deficiency, unspecified: Secondary | ICD-10-CM | POA: Diagnosis not present

## 2023-01-10 LAB — CMP14+EGFR
ALT: 48 IU/L — ABNORMAL HIGH (ref 0–44)
AST: 88 IU/L — ABNORMAL HIGH (ref 0–40)
Albumin/Globulin Ratio: 2.2 (ref 1.2–2.2)
Albumin: 4.8 g/dL (ref 4.1–5.1)
Alkaline Phosphatase: 61 IU/L (ref 44–121)
BUN/Creatinine Ratio: 18 (ref 9–20)
BUN: 16 mg/dL (ref 6–20)
Bilirubin Total: 1 mg/dL (ref 0.0–1.2)
CO2: 25 mmol/L (ref 20–29)
Calcium: 9.6 mg/dL (ref 8.7–10.2)
Chloride: 103 mmol/L (ref 96–106)
Creatinine, Ser: 0.91 mg/dL (ref 0.76–1.27)
Globulin, Total: 2.2 g/dL (ref 1.5–4.5)
Glucose: 84 mg/dL (ref 70–99)
Potassium: 4.1 mmol/L (ref 3.5–5.2)
Sodium: 142 mmol/L (ref 134–144)
Total Protein: 7 g/dL (ref 6.0–8.5)
eGFR: 111 mL/min/{1.73_m2} (ref >59)

## 2023-01-10 LAB — VITAMIN D 25 HYDROXY (VIT D DEFICIENCY, FRACTURES): Vit D, 25-Hydroxy: 26.3 ng/mL — ABNORMAL LOW (ref 30.0–100.0)

## 2023-01-10 LAB — LIPID PANEL WITH LDL/HDL RATIO
Cholesterol, Total: 198 mg/dL (ref 100–199)
HDL: 34 mg/dL — ABNORMAL LOW (ref >39)
LDL Chol Calc (NIH): 95 mg/dL (ref 0–99)
LDL/HDL Ratio: 2.8 ratio (ref 0.0–3.6)
Triglycerides: 413 mg/dL — ABNORMAL HIGH (ref 0–149)
VLDL Cholesterol Cal: 69 mg/dL — ABNORMAL HIGH (ref 5–40)

## 2023-01-10 LAB — HEMOGLOBIN A1C
Est. average glucose Bld gHb Est-mCnc: 91 mg/dL
Hgb A1c MFr Bld: 4.8 % (ref 4.8–5.6)

## 2023-01-10 LAB — INSULIN, RANDOM: INSULIN: 19.6 u[IU]/mL (ref 2.6–24.9)

## 2023-01-10 LAB — VITAMIN B12: Vitamin B-12: 351 pg/mL (ref 232–1245)

## 2023-01-13 NOTE — Progress Notes (Signed)
Chief Complaint:   OBESITY Jonathan Hutchinson is here to discuss his progress with his obesity treatment plan along with follow-up of his obesity related diagnoses. Jonathan Hutchinson is on keeping a food journal and adhering to recommended goals of 2200 calories and 150 grams of protein and states he is following his eating plan approximately 90% of the time. Jonathan Hutchinson states he is doing cardio and resistance for 60 minutes 4-5 times per week.  Today's visit was #: 13 Starting weight: 291 lbs Starting date: 01/30/2022 Today's weight: 243 lbs Today's date: 01/09/2023 Total lbs lost to date: 48 Total lbs lost since last in-office visit: 0  Interim History: Jonathan Hutchinson continues to do well with maintaining his weight.  He is working on meeting his protein goals, but he is using a fair amount of protein supplements.  Subjective:   1. Vitamin D deficiency Jonathan Hutchinson is on vitamin D, and he is due for labs.  He denies nausea, vomiting, or muscle weakness.  2. Mixed hyperlipidemia Jonathan Hutchinson is working on his diet and exercise.  He denies chest pain, and he is not on statin.  3. B12 deficiency Jonathan Hutchinson is due to have his labs done.  No symptoms were noted.  4. Insulin resistance Jonathan Hutchinson is working on decreasing simple carbohydrates and increasing protein.  He is not on metformin.  Assessment/Plan:   1. Vitamin D deficiency We will check labs today.  Jamauri will continue prescription vitamin D 50,000 IU once weekly.  - VITAMIN D 25 Hydroxy (Vit-D Deficiency, Fractures)  2. Mixed hyperlipidemia We will check labs today.  Canan will continue with his diet and exercise.  - Lipid Panel With LDL/HDL Ratio  3. B12 deficiency We will check labs today, and we will follow-up at [facility] next visit.  - Vitamin B12  4. Insulin resistance We will check labs today.  Joshuel will continue with his diet and exercise.  - CMP14+EGFR - Hemoglobin A1c - Insulin, random  5. BMI 33.0-33.9,adult  6. Obesity, Beginning BMI  40.59 Jonathan Hutchinson is currently in the action stage of change. As such, his goal is to continue with weight loss efforts. He has agreed to keeping a food journal and adhering to recommended goals of 2000 calories and 130+ grams of protein daily.   Jonathan Hutchinson is to consider increasing "real food" protein for the extra calorie burning.  Exercise goals: As is.   Behavioral modification strategies: increasing lean protein intake.  Jonathan Hutchinson has agreed to follow-up with our clinic in 4 weeks. He was informed of the importance of frequent follow-up visits to maximize his success with intensive lifestyle modifications for his multiple health conditions.   Jonathan Hutchinson was informed we would discuss his lab results at his next visit unless there is a critical issue that needs to be addressed sooner. Jonathan Hutchinson agreed to keep his next visit at the agreed upon time to discuss these results.  Objective:   Blood pressure 126/75, pulse 97, temperature (!) 97.4 F (36.3 C), height 5\' 11"  (1.803 m), weight 243 lb (110.2 kg), SpO2 96 %. Body mass index is 33.89 kg/m.  Lab Results  Component Value Date   CREATININE 0.91 01/09/2023   BUN 16 01/09/2023   NA 142 01/09/2023   K 4.1 01/09/2023   CL 103 01/09/2023   CO2 25 01/09/2023   Lab Results  Component Value Date   ALT 48 (H) 01/09/2023   AST 88 (H) 01/09/2023   ALKPHOS 61 01/09/2023   BILITOT 1.0 01/09/2023   Lab Results  Component Value Date   HGBA1C 4.8 01/09/2023   HGBA1C 5.1 01/30/2022   Lab Results  Component Value Date   INSULIN 19.6 01/09/2023   INSULIN 32.8 (H) 01/30/2022   Lab Results  Component Value Date   TSH 2.020 01/30/2022   Lab Results  Component Value Date   CHOL 198 01/09/2023   HDL 34 (L) 01/09/2023   LDLCALC 95 01/09/2023   LDLDIRECT 113.0 10/02/2022   TRIG 413 (H) 01/09/2023   CHOLHDL 6 10/02/2022   Lab Results  Component Value Date   VD25OH 26.3 (L) 01/09/2023   VD25OH 24.95 (L) 10/02/2022   VD25OH 37.5 05/20/2022   Lab  Results  Component Value Date   WBC 7.3 08/21/2022   HGB 15.5 08/21/2022   HCT 45.6 08/21/2022   MCV 88.2 08/21/2022   PLT 228.0 08/21/2022   No results found for: "IRON", "TIBC", "FERRITIN"  Attestation Statements:   Reviewed by clinician on day of visit: allergies, medications, problem list, medical history, surgical history, family history, social history, and previous encounter notes.  I have personally spent 43 minutes total time today in preparation, patient care, and documentation for this visit, including the following: review of clinical lab tests; review of medical tests/procedures/services.   I, Burt Knack, am acting as transcriptionist for Quillian Quince, MD.  I have reviewed the above documentation for accuracy and completeness, and I agree with the above. -  Quillian Quince, MD

## 2023-02-10 ENCOUNTER — Encounter (INDEPENDENT_AMBULATORY_CARE_PROVIDER_SITE_OTHER): Payer: Self-pay | Admitting: Family Medicine

## 2023-02-10 ENCOUNTER — Ambulatory Visit (INDEPENDENT_AMBULATORY_CARE_PROVIDER_SITE_OTHER): Payer: 59 | Admitting: Family Medicine

## 2023-02-10 VITALS — BP 139/87 | HR 82 | Temp 97.7°F | Ht 71.0 in | Wt 245.0 lb

## 2023-02-10 DIAGNOSIS — R748 Abnormal levels of other serum enzymes: Secondary | ICD-10-CM | POA: Diagnosis not present

## 2023-02-10 DIAGNOSIS — E669 Obesity, unspecified: Secondary | ICD-10-CM

## 2023-02-10 DIAGNOSIS — E559 Vitamin D deficiency, unspecified: Secondary | ICD-10-CM | POA: Diagnosis not present

## 2023-02-10 DIAGNOSIS — E88819 Insulin resistance, unspecified: Secondary | ICD-10-CM

## 2023-02-10 DIAGNOSIS — E781 Pure hyperglyceridemia: Secondary | ICD-10-CM

## 2023-02-10 DIAGNOSIS — Z6834 Body mass index (BMI) 34.0-34.9, adult: Secondary | ICD-10-CM

## 2023-02-10 MED ORDER — METFORMIN HCL ER 500 MG PO TB24
500.0000 mg | ORAL_TABLET | Freq: Every day | ORAL | 0 refills | Status: DC
Start: 2023-02-10 — End: 2023-03-17

## 2023-02-10 MED ORDER — VITAMIN D (ERGOCALCIFEROL) 1.25 MG (50000 UNIT) PO CAPS
50000.0000 [IU] | ORAL_CAPSULE | ORAL | 0 refills | Status: DC
Start: 2023-02-10 — End: 2023-03-17

## 2023-02-10 MED ORDER — FENOFIBRATE 145 MG PO TABS
145.0000 mg | ORAL_TABLET | Freq: Every day | ORAL | 0 refills | Status: DC
Start: 2023-02-10 — End: 2023-03-17

## 2023-02-10 NOTE — Progress Notes (Signed)
Office: [phone]  /  Fax: [phone]  WEIGHT SUMMARY AND BIOMETRICS  Starting Date: 01/30/22  Starting Weight: 291lb   Weight Lost Since Last Visit: 0   Vitals Temp: 97.7 F (36.5 C) BP: 139/87 Pulse Rate: 82 SpO2: 97 %   Body Composition  Body Fat %: 28.5 % Fat Mass (lbs): 70 lbs Muscle Mass (lbs): 167.2 lbs Total Body Water (lbs): 122.8 lbs Visceral Fat Rating : 13     HPI  Chief Complaint: OBESITY  Jonathan Hutchinson is here to discuss his progress with his obesity treatment plan. He is on the the Category 4 Plan and states he is following his eating plan approximately 70 % of the time. He states he is exercising 30 minutes 2-3 times per week.   Interval History:  Since last office visit he is up 2 lb He is up 0.6 lb of muscle mass and up 2 lb in body fat Due to time/ work, he has not been logging his intake or working out on a regular basis He has a net weight loss of 46 lb in the past year He would like to lose about 30 more pounds His weight was ~280 lb for the previous 5 year He has not used anti obesity medications in the past year He was doing better when logging his intake  Pharmacotherapy: none  PHYSICAL EXAM:  Blood pressure 139/87, pulse 82, temperature 97.7 F (36.5 C), height 5\' 11"  (1.803 m), weight 245 lb (111.1 kg), SpO2 97 %. Body mass index is 34.17 kg/m.  General: He is overweight, cooperative, alert, well developed, and in no acute distress. PSYCH: Has normal mood, affect and thought process.   Lungs: Normal breathing effort, no conversational dyspnea.   ASSESSMENT AND PLAN  TREATMENT PLAN FOR OBESITY:  Recommended Dietary Goals  [name] is currently in the action stage of change. As such, his goal is to continue weight management plan. He has agreed to keeping a food journal and adhering to recommended goals of 2200 calories and 130 g of  protein.  Behavioral Intervention  We discussed the following Behavioral Modification  Strategies today: increasing lean protein intake, decreasing simple carbohydrates , increasing vegetables, increasing lower glycemic fruits, increasing water intake, work on meal planning and preparation, work on tracking and journaling calories using tracking application, continue to practice mindfulness when eating, and planning for success.  Additional resources provided today: NA  Recommended Physical Activity Goals  Jonathan Hutchinson has been advised to work up to 150 minutes of moderate intensity aerobic activity a week and strengthening exercises 2-3 times per week for cardiovascular health, weight loss maintenance and preservation of muscle mass.   He has agreed to [employer] strengthening exercises with a goal of 2-3 sessions a week   Pharmacotherapy changes for the treatment of obesity:  begin metformin XR 500 mg daily with food  ASSOCIATED CONDITIONS ADDRESSED TODAY  Insulin resistance Assessment & Plan: Reviewed labs from last visit Fasting insulin is improving but still elevated at 19.6, down from 32.8. He has some hyperphagia related to IR. He has never been on metformin He has tried to reduce his intake of sugar  Continue to work on a low sugar/ lower starch diet diet.  Begin metformin XR 500 mg once daily with food.  Increase walking time to improve insulin sensitivity.  Orders: -     metFORMIN HCl ER; Take 1 tablet (500 mg total) by mouth daily with breakfast.  Dispense: 30 tablet; Refill: 0  Vitamin D deficiency  Assessment & Plan: Last vitamin D Lab Results  Component Value Date   VD25OH 26.3 (L) 01/09/2023   Reviewed lab from last visit.  His vitamin D level has not improved but he had questionable adherence to taking his RX vitaminD 50,000 IU every week.  Will refill RX vitamin D 50,000 IU once weekly and recheck level in 3-4 mos with a goal > 50.  Orders: -     Vitamin D (Ergocalciferol); Take 1 capsule (50,000 Units total) by mouth every 7 (seven) days.  Dispense: 12  capsule; Refill: 0  Elevated liver enzymes Assessment & Plan: Reviewed labs from last visit. AST and ALT elevated on CMP without a formal dx of NAFLD.  He denies a hx of hepatitis or heavy alcohol intake. He is at risk for fatty liver disease.    Will order liver ultrasound to evaluate. Avoid excess alcohol and tylenol intake Continue active plan for weight loss  Orders: -     US ABDOMEN LIMITED RUQ (LIVER/GB); Future  Obesity, Beginning BMI 40.59  BMI 34.0-34.9,adult  Hypertriglyceridemia Assessment & Plan: Reviewed lab from last visit.  Lab Results  Component Value Date   CHOL 198 01/09/2023   HDL 34 (L) 01/09/2023   LDLCALC 95 01/09/2023   LDLDIRECT 113.0 10/02/2022   TRIG 413 (H) 01/09/2023   CHOLHDL 6 10/02/2022   His PCP stopped his Crestor in the fall.  His TG and HDL fall into the metabolic syndrome picture.  He has been on Tricor in the past without problem.  Restart Fenofibrate 145 mg daily. Limit intake of saturated and trans fats.  Treat metabolic syndrome with metformin XR 500 mg daily + a low sugar/ low refined carbohydrate diet.  Orders: -     Fenofibrate; Take 1 tablet (145 mg total) by mouth daily.  Dispense: 30 tablet; Refill: 0      He was informed of the importance of frequent follow up visits to maximize his success with intensive lifestyle modifications for his multiple health conditions.   ATTESTASTION STATEMENTS:  Reviewed by clinician on day of visit: allergies, medications, problem list, medical history, surgical history, family history, social history, and previous encounter notes pertinent to obesity diagnosis.   I have personally spent 30 minutes total time today in preparation, patient care, nutritional counseling and documentation for this visit, including the following: review of clinical lab tests; review of medical tests/procedures/services.      [name], DO DABFM, DABOM [facility] [address]  [place], [place] [address] [phone]

## 2023-02-10 NOTE — Assessment & Plan Note (Signed)
Reviewed labs from last visit. AST and ALT elevated on CMP without a formal dx of NAFLD.  He denies a hx of hepatitis or heavy alcohol intake. He is at risk for fatty liver disease.    Will order liver ultrasound to evaluate. Avoid excess alcohol and tylenol intake Continue active plan for weight loss

## 2023-02-10 NOTE — Assessment & Plan Note (Signed)
Reviewed labs from last visit Fasting insulin is improving but still elevated at 19.6, down from 32.8. He has some hyperphagia related to IR. He has never been on metformin He has tried to reduce his intake of sugar  Continue to work on a low sugar/ lower starch diet diet.  Begin metformin XR 500 mg once daily with food.  Increase walking time to improve insulin sensitivity.

## 2023-02-10 NOTE — Assessment & Plan Note (Signed)
Reviewed lab from last visit.  Lab Results  Component Value Date   CHOL 198 01/09/2023   HDL 34 (L) 01/09/2023   LDLCALC 95 01/09/2023   LDLDIRECT 113.0 10/02/2022   TRIG 413 (H) 01/09/2023   CHOLHDL 6 10/02/2022   His PCP stopped his Crestor in the fall.  His TG and HDL fall into the metabolic syndrome picture.  He has been on Tricor in the past without problem.  Restart Fenofibrate 145 mg daily. Limit intake of saturated and trans fats.  Treat metabolic syndrome with metformin XR 500 mg daily + a low sugar/ low refined carbohydrate diet.

## 2023-02-10 NOTE — Assessment & Plan Note (Signed)
Last vitamin D Lab Results  Component Value Date   VD25OH 26.3 (L) 01/09/2023   Reviewed lab from last visit.  His vitamin D level has not improved but he had questionable adherence to taking his RX vitaminD 50,000 IU every week.  Will refill RX vitamin D 50,000 IU once weekly and recheck level in 3-4 mos with a goal > 50.

## 2023-02-19 ENCOUNTER — Ambulatory Visit: Payer: 59 | Admitting: Family Medicine

## 2023-02-19 ENCOUNTER — Other Ambulatory Visit: Payer: Self-pay | Admitting: Family Medicine

## 2023-02-19 ENCOUNTER — Ambulatory Visit (HOSPITAL_BASED_OUTPATIENT_CLINIC_OR_DEPARTMENT_OTHER)
Admission: RE | Admit: 2023-02-19 | Discharge: 2023-02-19 | Disposition: A | Discharge status: Home / Self Care | Payer: 59 | Source: Ambulatory Visit | Attending: Family Medicine | Admitting: Family Medicine

## 2023-02-19 ENCOUNTER — Encounter: Payer: Self-pay | Admitting: Family Medicine

## 2023-02-19 VITALS — BP 121/80 | HR 75 | Temp 98.8°F | Ht 71.0 in | Wt 252.2 lb

## 2023-02-19 DIAGNOSIS — R748 Abnormal levels of other serum enzymes: Secondary | ICD-10-CM | POA: Insufficient documentation

## 2023-02-19 DIAGNOSIS — E781 Pure hyperglyceridemia: Secondary | ICD-10-CM

## 2023-02-19 DIAGNOSIS — E782 Mixed hyperlipidemia: Secondary | ICD-10-CM

## 2023-02-19 LAB — COMPREHENSIVE METABOLIC PANEL
ALT: 18 U/L (ref 0–53)
AST: 19 U/L (ref 0–37)
Albumin: 4.5 g/dL (ref 3.5–5.2)
Alkaline Phosphatase: 56 U/L (ref 39–117)
BUN: 16 mg/dL (ref 6–23)
CO2: 27 mEq/L (ref 19–32)
Calcium: 9.2 mg/dL (ref 8.4–10.5)
Chloride: 104 mEq/L (ref 96–112)
Creatinine, Ser: 0.91 mg/dL (ref 0.40–1.50)
GFR: 107.79 mL/min (ref >60.00)
Glucose, Bld: 86 mg/dL (ref 70–99)
Potassium: 3.9 mEq/L (ref 3.5–5.1)
Sodium: 140 mEq/L (ref 135–145)
Total Bilirubin: 1 mg/dL (ref 0.2–1.2)
Total Protein: 6.8 g/dL (ref 6.0–8.3)

## 2023-02-19 LAB — LIPID PANEL
Cholesterol: 193 mg/dL (ref 0–200)
HDL: 36.9 mg/dL — ABNORMAL LOW (ref >39.00)
NonHDL: 156.55
Total CHOL/HDL Ratio: 5
Triglycerides: 398 mg/dL — ABNORMAL HIGH (ref 0.0–149.0)
VLDL: 79.6 mg/dL — ABNORMAL HIGH (ref 0.0–40.0)

## 2023-02-19 LAB — LDL CHOLESTEROL, DIRECT: Direct LDL: 84 mg/dL

## 2023-02-19 MED ORDER — ROSUVASTATIN CALCIUM 10 MG PO TABS
10.0000 mg | ORAL_TABLET | Freq: Every day | ORAL | 10 refills | Status: DC
Start: 2023-02-19 — End: 2023-09-22

## 2023-02-19 NOTE — Progress Notes (Signed)
Call to patient to make him aware of Dr Ovidio Kin message.  Patient verbalized understanding.  Will send my chart message also.

## 2023-02-19 NOTE — Patient Instructions (Signed)
Give us 2-3 business days to get the results of your labs back.   Keep the diet clean and stay active.  Let us know if you need anything. 

## 2023-02-19 NOTE — Addendum Note (Signed)
Addended by: [name] on: 02/19/2023 03:32 PM   Modules accepted: Orders

## 2023-02-19 NOTE — Progress Notes (Signed)
Chief Complaint  Patient presents with   Follow-up    Subjective: Hypertriglyceridemia Patient presents for hypertriglyceridemia follow up. Currently taking fenofibrate 145 mg/d and compliance with treatment thus far has been good. He denies myalgias. He is usually adhering to a healthy diet. Exercise: cardio The patient is not known to have coexisting coronary artery disease.  Past Medical History:  Diagnosis Date   Back pain    High blood pressure    Joint pain    Mixed hyperlipidemia    Ruptured disk    Sleep apnea     Objective: BP 121/80 (BP Location: Left Arm, Patient Position: Sitting, Cuff Size: Large)   Pulse 75   Temp 98.8 F (37.1 C) (Oral)   Ht 5\' 11"  (1.803 m)   Wt 252 lb 4 oz (114.4 kg)   SpO2 96%   BMI 35.18 kg/m  General: Awake, appears stated age Heart: RRR, no LE edema, no bruits Lungs: CTAB, no rales, wheezes or rhonchi. No accessory muscle use Psych: Age appropriate judgment and insight, normal affect and mood  Assessment and Plan: Hypertriglyceridemia - Plan: Comprehensive metabolic panel, Lipid panel  Chronic, not controlled.  Continue fenofibrate 145 mg daily.  Check labs that we will likely after her start him on Crestor. F/u in 6 mo. we will have him return for labs in 6 weeks if we do start him on Crestor again. The patient voiced understanding and agreement to the plan.  Jilda Roche [place], DO 02/19/23  10:29 AM

## 2023-02-28 ENCOUNTER — Encounter: Payer: Self-pay | Admitting: Gastroenterology

## 2023-03-17 ENCOUNTER — Ambulatory Visit (INDEPENDENT_AMBULATORY_CARE_PROVIDER_SITE_OTHER): Payer: 59 | Admitting: Family Medicine

## 2023-03-17 ENCOUNTER — Encounter (INDEPENDENT_AMBULATORY_CARE_PROVIDER_SITE_OTHER): Payer: Self-pay | Admitting: Family Medicine

## 2023-03-17 VITALS — BP 132/91 | HR 68 | Temp 98.2°F | Ht 71.0 in | Wt 246.0 lb

## 2023-03-17 DIAGNOSIS — R7401 Elevation of levels of liver transaminase levels: Secondary | ICD-10-CM | POA: Insufficient documentation

## 2023-03-17 DIAGNOSIS — E781 Pure hyperglyceridemia: Secondary | ICD-10-CM | POA: Diagnosis not present

## 2023-03-17 DIAGNOSIS — Z6834 Body mass index (BMI) 34.0-34.9, adult: Secondary | ICD-10-CM

## 2023-03-17 DIAGNOSIS — E88819 Insulin resistance, unspecified: Secondary | ICD-10-CM

## 2023-03-17 DIAGNOSIS — E559 Vitamin D deficiency, unspecified: Secondary | ICD-10-CM | POA: Diagnosis not present

## 2023-03-17 MED ORDER — FENOFIBRATE 145 MG PO TABS
145.0000 mg | ORAL_TABLET | Freq: Every day | ORAL | 0 refills | Status: DC
Start: 2023-03-17 — End: 2023-05-21

## 2023-03-17 MED ORDER — VITAMIN D (ERGOCALCIFEROL) 1.25 MG (50000 UNIT) PO CAPS
50000.0000 [IU] | ORAL_CAPSULE | ORAL | 0 refills | Status: DC
Start: 2023-03-17 — End: 2023-05-21

## 2023-03-17 MED ORDER — METFORMIN HCL ER 500 MG PO TB24
500.0000 mg | ORAL_TABLET | Freq: Every day | ORAL | 0 refills | Status: DC
Start: 2023-03-17 — End: 2023-05-21

## 2023-03-17 NOTE — Assessment & Plan Note (Signed)
His liver enzymes have improved, repeated by his PCP 5/15.  Reviewed the results of his liver ultrasound done 02/19/2023 which was normal.  He is scheduled to see gastroenterology in August.  He is asymptomatic.

## 2023-03-17 NOTE — Progress Notes (Signed)
Office: [phone]  /  Fax: [phone]  WEIGHT SUMMARY AND BIOMETRICS  Starting Date: 01/30/22  Starting Weight: 291lb   Weight Lost Since Last Visit: 0lb   Vitals Temp: 98.2 F (36.8 C) BP: (!) 132/91 Pulse Rate: 68 SpO2: 97 %   Body Composition  Body Fat %: 28.3 % Fat Mass (lbs): 69.6 lbs Muscle Mass (lbs): 167.8 lbs Total Body Water (lbs): 124.2 lbs Visceral Fat Rating : 13   HPI  Chief Complaint: OBESITY  Jonathan Hutchinson is here to discuss his progress with his obesity treatment plan. He is on the the Category 4 Plan and states he is following his eating plan approximately 70 % of the time. He states he is exercising 45 minutes 2 times per week.   Interval History:  Since last office visit he is up 1 lb He is up 0.6 lb of muscle mass but hasn't been able to add in weight training He is eating breakfast around 9 am and dinner but skips lunch while at work and has some snacks at night.  Denies a large dinner or 'bad' choices at dinner He is less consistent with logging He is tolerating metformin XR 500 mg once daily without problems He is avoiding fast food while at work He is good about getting veggies with dinner His net weight loss is 45 lb in the past 13 mos of medically supervised weight management This is a 15.4% TBW loss   Pharmacotherapy: none  PHYSICAL EXAM:  Blood pressure (!) 132/91, pulse 68, temperature 98.2 F (36.8 C), height 5\' 11"  (1.803 m), weight 246 lb (111.6 kg), SpO2 97 %. Body mass index is 34.31 kg/m.  General: He is overweight, cooperative, alert, well developed, and in no acute distress. PSYCH: Has normal mood, affect and thought process.   Lungs: Normal breathing effort, no conversational dyspnea.   ASSESSMENT AND PLAN  TREATMENT PLAN FOR OBESITY:  Recommended Dietary Goals  Argle is currently in the action stage of change. As such, his goal is to continue weight management plan. He has agreed to keeping a food journal and  adhering to recommended goals of 2200-2500 calories and 130-150 g of  protein.  Behavioral Intervention  We discussed the following Behavioral Modification Strategies today: increasing lean protein intake, decreasing simple carbohydrates , increasing vegetables, increasing lower glycemic fruits, increasing fiber rich foods, increasing water intake, work on meal planning and preparation, work on Counselling psychologist calories using tracking application, keeping healthy foods at home, avoiding temptations and identifying enticing environmental cues, continue to practice mindfulness when eating, and planning for success.  Additional resources provided today: NA  Recommended Physical Activity Goals  Harrington has been advised to work up to 150 minutes of moderate intensity aerobic activity a week and strengthening exercises 2-3 times per week for cardiovascular health, weight loss maintenance and preservation of muscle mass.   He has agreed to [employer] strengthening exercises with a goal of 2-3 sessions a week   Pharmacotherapy changes for the treatment of obesity: none  ASSOCIATED CONDITIONS ADDRESSED TODAY  Hypertriglyceridemia Assessment & Plan: Lab Results  Component Value Date   CHOL 193 02/19/2023   HDL 36.90 (L) 02/19/2023   LDLCALC 95 01/09/2023   LDLDIRECT 84.0 02/19/2023   TRIG 398.0 (H) 02/19/2023   CHOLHDL 5 02/19/2023   He is tolerating fenofibrate 145 mg once daily well.  He has adherent to a low saturated fat diet.  He plans to increase his exercise frequency.  Continue current dietary plan.  Continue fenofibrate 145 mg once daily.  Orders: -     Fenofibrate; Take 1 tablet (145 mg total) by mouth daily.  Dispense: 30 tablet; Refill: 0  Insulin resistance Assessment & Plan: He is doing well with new start metformin XR 500 mg once daily with food.  He denies GI side effects.  He is actively working on reducing his intake of starches and sweets.  He has room for improvement  with consistent exercise including both cardio and resistance training exercise.  He is scheduled for repeat chemistry panel.  Plan to draw in the next 1 to 2 months if not already done by his PCP.  Continue metformin XR 500 mg once daily.  Increase exercise frequency to 4 days a week.  Orders: -     metFORMIN HCl ER; Take 1 tablet (500 mg total) by mouth daily with breakfast.  Dispense: 30 tablet; Refill: 0  Vitamin D deficiency Assessment & Plan: Last vitamin D Lab Results  Component Value Date   VD25OH 26.3 (L) 01/09/2023   He is doing well on prescription vitamin D 50,000 IU once weekly.  Energy level has improved.  Continue vitamin D 50,000 IU once weekly.  Repeat level in August  Orders: -     Vitamin D (Ergocalciferol); Take 1 capsule (50,000 Units total) by mouth every 7 (seven) days.  Dispense: 12 capsule; Refill: 0  Morbid obesity (HCC) with starting BMI 40.6 Assessment & Plan: Jonathan Hutchinson's weight loss has plateaued over the past few months.  He has maintained 45 pounds of weight loss which is a 15% total body weight loss since starting our program over 1 year ago.  He would like to lose additional weight.  His insurance lacks coverage for GLP-1 receptor agonist.  He has declined use of Contrave or Qsymia.  Recommend using the my fitness pal app to log daily intake averaging 2200 to 2500 cal/day.  His protein target is 130 to 150 g/day.  Recommend weight training exercise added in 2 days a week in addition to cardio 3 days a week.   BMI 34.0-34.9,adult  Transaminitis Assessment & Plan: His liver enzymes have improved, repeated by his PCP 5/15.  Reviewed the results of his liver ultrasound done 02/19/2023 which was normal.  He is scheduled to see gastroenterology in August.  He is asymptomatic.       He was informed of the importance of frequent follow up visits to maximize his success with intensive lifestyle modifications for his multiple health  conditions.   ATTESTASTION STATEMENTS:  Reviewed by clinician on day of visit: allergies, medications, problem list, medical history, surgical history, family history, social history, and previous encounter notes pertinent to obesity diagnosis.   I have personally spent 30 minutes total time today in preparation, patient care, nutritional counseling and documentation for this visit, including the following: review of clinical lab tests; review of medical tests/procedures/services.      [name], DO DABFM, DABOM [facility] [address] [place], [place] [address] [phone]

## 2023-03-17 NOTE — Assessment & Plan Note (Signed)
He is doing well with new start metformin XR 500 mg once daily with food.  He denies GI side effects.  He is actively working on reducing his intake of starches and sweets.  He has room for improvement with consistent exercise including both cardio and resistance training exercise.  He is scheduled for repeat chemistry panel.  Plan to draw in the next 1 to 2 months if not already done by his PCP.  Continue metformin XR 500 mg once daily.  Increase exercise frequency to 4 days a week.

## 2023-03-17 NOTE — Assessment & Plan Note (Signed)
Lab Results  Component Value Date   CHOL 193 02/19/2023   HDL 36.90 (L) 02/19/2023   LDLCALC 95 01/09/2023   LDLDIRECT 84.0 02/19/2023   TRIG 398.0 (H) 02/19/2023   CHOLHDL 5 02/19/2023   He is tolerating fenofibrate 145 mg once daily well.  He has adherent to a low saturated fat diet.  He plans to increase his exercise frequency.  Continue current dietary plan.  Continue fenofibrate 145 mg once daily.

## 2023-03-17 NOTE — Assessment & Plan Note (Signed)
Doug's weight loss has plateaued over the past few months.  He has maintained 45 pounds of weight loss which is a 15% total body weight loss since starting our program over 1 year ago.  He would like to lose additional weight.  His insurance lacks coverage for GLP-1 receptor agonist.  He has declined use of Contrave or Qsymia.  Recommend using the my fitness pal app to log daily intake averaging 2200 to 2500 cal/day.  His protein target is 130 to 150 g/day.  Recommend weight training exercise added in 2 days a week in addition to cardio 3 days a week.

## 2023-03-17 NOTE — Assessment & Plan Note (Signed)
Last vitamin D Lab Results  Component Value Date   VD25OH 26.3 (L) 01/09/2023   He is doing well on prescription vitamin D 50,000 IU once weekly.  Energy level has improved.  Continue vitamin D 50,000 IU once weekly.  Repeat level in August

## 2023-04-02 ENCOUNTER — Other Ambulatory Visit (INDEPENDENT_AMBULATORY_CARE_PROVIDER_SITE_OTHER): Payer: 59

## 2023-04-02 DIAGNOSIS — E781 Pure hyperglyceridemia: Secondary | ICD-10-CM | POA: Diagnosis not present

## 2023-04-02 LAB — LIPID PANEL
Cholesterol: 161 mg/dL (ref 0–200)
HDL: 31.4 mg/dL — ABNORMAL LOW (ref >39.00)
NonHDL: 129.93
Total CHOL/HDL Ratio: 5
Triglycerides: 283 mg/dL — ABNORMAL HIGH (ref 0.0–149.0)
VLDL: 56.6 mg/dL — ABNORMAL HIGH (ref 0.0–40.0)

## 2023-04-02 LAB — HEPATIC FUNCTION PANEL
ALT: 18 U/L (ref 0–53)
AST: 19 U/L (ref 0–37)
Albumin: 4.5 g/dL (ref 3.5–5.2)
Alkaline Phosphatase: 49 U/L (ref 39–117)
Bilirubin, Direct: 0.2 mg/dL (ref 0.0–0.3)
Total Bilirubin: 0.9 mg/dL (ref 0.2–1.2)
Total Protein: 6.6 g/dL (ref 6.0–8.3)

## 2023-04-02 LAB — LDL CHOLESTEROL, DIRECT: Direct LDL: 89 mg/dL

## 2023-05-05 ENCOUNTER — Ambulatory Visit (INDEPENDENT_AMBULATORY_CARE_PROVIDER_SITE_OTHER): Payer: 59 | Admitting: Family Medicine

## 2023-05-21 ENCOUNTER — Ambulatory Visit (INDEPENDENT_AMBULATORY_CARE_PROVIDER_SITE_OTHER): Payer: 59 | Admitting: Family Medicine

## 2023-05-21 ENCOUNTER — Encounter (INDEPENDENT_AMBULATORY_CARE_PROVIDER_SITE_OTHER): Payer: Self-pay | Admitting: Family Medicine

## 2023-05-21 VITALS — BP 118/79 | HR 66 | Temp 98.8°F | Ht 71.0 in | Wt 251.0 lb

## 2023-05-21 DIAGNOSIS — Z6835 Body mass index (BMI) 35.0-35.9, adult: Secondary | ICD-10-CM

## 2023-05-21 DIAGNOSIS — E88819 Insulin resistance, unspecified: Secondary | ICD-10-CM | POA: Diagnosis not present

## 2023-05-21 DIAGNOSIS — E559 Vitamin D deficiency, unspecified: Secondary | ICD-10-CM

## 2023-05-21 DIAGNOSIS — E781 Pure hyperglyceridemia: Secondary | ICD-10-CM

## 2023-05-21 MED ORDER — FENOFIBRATE 145 MG PO TABS: 145.0000 mg | ORAL_TABLET | Freq: Every day | ORAL | 0 refills | Status: DC

## 2023-05-21 MED ORDER — VITAMIN D (ERGOCALCIFEROL) 1.25 MG (50000 UNIT) PO CAPS
50000.0000 [IU] | ORAL_CAPSULE | ORAL | 0 refills | Status: DC
Start: 2023-05-21 — End: 2023-06-23

## 2023-05-21 MED ORDER — METFORMIN HCL ER 500 MG PO TB24
500.0000 mg | ORAL_TABLET | Freq: Every day | ORAL | 0 refills | Status: DC
Start: 2023-05-21 — End: 2023-06-23

## 2023-05-21 NOTE — Assessment & Plan Note (Signed)
Improving.  Fasting insulin has improved with healthy lifestyle changes and he is tolerating metformin XR 500 mg once daily with breakfast well.  He plans to increase walking time to help with insulin sensitivity.    Continue metformin XR 500 mg once daily Will check CMP, B12,FLP, D  fasting insulin, A1c next visit (not fasting today)

## 2023-05-21 NOTE — Assessment & Plan Note (Signed)
Lab Results  Component Value Date   CHOL 161 04/02/2023   HDL 31.40 (L) 04/02/2023   LDLCALC 95 01/09/2023   LDLDIRECT 89.0 04/02/2023   TRIG 283.0 (H) 04/02/2023   CHOLHDL 5 04/02/2023  He is doing well on Tricor 145 mg once daily without adverse SE He is working on a low saturated fat diet with reduced sugar intake He plans to increase exercise frequency

## 2023-05-21 NOTE — Progress Notes (Signed)
Office: [phone]  /  Fax: [phone]  WEIGHT SUMMARY AND BIOMETRICS  Starting Date: 01/30/22  Starting Weight: 291lb   Weight Lost Since Last Visit: 0lb   Vitals Temp: 98.8 F (37.1 C) BP: 118/79 Pulse Rate: 66 SpO2: 98 %   Body Composition  Body Fat %: 29.1 % Fat Mass (lbs): 73 lbs Muscle Mass (lbs): 169.2 lbs Total Body Water (lbs): 123.2 lbs Visceral Fat Rating : 13    HPI  Chief Complaint: OBESITY  Jonathan Hutchinson is here to discuss his progress with his obesity treatment plan. He is on the the Category 4 Plan and states he is following his eating plan approximately 70 % of the time. He states he is exercising 0 minutes 0 times per week.   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 Body mass index is 35.01 kg/m.  General: He is overweight, cooperative, alert, well developed, and in no acute distress. PSYCH: Has normal mood, affect and thought process.   Lungs: Normal breathing effort, no conversational dyspnea.   ASSESSMENT AND PLAN  TREATMENT PLAN FOR OBESITY:  Recommended Dietary Goals  Jonathan Hutchinson is currently in the action stage of change. As such, his goal is to continue weight management plan. He has agreed to the Category 4 Plan.  Behavioral Intervention  We discussed the following Behavioral Modification Strategies today: increasing lean protein  intake, decreasing simple carbohydrates , increasing vegetables, increasing lower glycemic fruits, avoiding skipping meals, increasing water intake, work on meal planning and preparation, keeping healthy foods at home, continue to work on implementation of reduced calorie nutritional plan, continue to practice mindfulness when eating, and planning for success.  Additional resources provided today: NA  Recommended Physical Activity Goals  Jonathan Hutchinson has been advised to work up to 150 minutes of moderate intensity aerobic activity a week and strengthening exercises 2-3 times per week for cardiovascular health, weight loss maintenance and preservation of muscle mass.   He has agreed to [employer] strengthening exercises with a goal of 2-3 sessions a week  - we set a goal for weight training at the gym 2 x a week  Pharmacotherapy changes for the treatment of obesity: none  ASSOCIATED CONDITIONS ADDRESSED TODAY  Hypertriglyceridemia Assessment & Plan: Lab Results  Component Value Date   CHOL 161 04/02/2023   HDL 31.40 (L) 04/02/2023   LDLCALC 95 01/09/2023   LDLDIRECT 89.0 04/02/2023   TRIG 283.0 (H) 04/02/2023   CHOLHDL 5 04/02/2023  He is doing well on Tricor 145 mg once daily without adverse SE He is working on a low saturated fat diet with reduced sugar intake He plans to increase exercise frequency     Orders: -     Fenofibrate; Take 1 tablet (145 mg total) by mouth daily.  Dispense: 30 tablet; Refill: 0  Vitamin D deficiency Assessment & Plan: Last vitamin D Lab Results  Component Value Date   VD25OH 26.3 (L) 01/09/2023   He is doing well on RX vitamin D weekly, sometimes forgetting to take Energy level slowly improving  Repeat vitamin D level next visit  Orders: -     Vitamin D (Ergocalciferol); Take 1 capsule (50,000 Units total) by mouth every 7 (seven) days.  Dispense: 12 capsule; Refill: 0  Insulin resistance Assessment & Plan: Improving.  Fasting insulin has improved  with healthy lifestyle changes and he is tolerating metformin XR 500 mg once daily with breakfast well.  He plans to increase walking time to help with insulin sensitivity.    Continue metformin XR 500 mg once daily Will check CMP, B12,FLP, D  fasting insulin, A1c next visit (not fasting today)  Orders: -     metFORMIN HCl ER; Take 1 tablet (500 mg total) by mouth daily with breakfast.  Dispense: 30 tablet; Refill: 0  Morbid obesity (HCC) with starting BMI 40.6  BMI 35.0-35.9,adult      He was informed of the importance of frequent follow up visits to maximize his success with intensive lifestyle modifications for his multiple health conditions.   ATTESTASTION STATEMENTS:  Reviewed by clinician on day of visit: allergies, medications, problem list, medical history, surgical history, family history, social history, and previous encounter notes pertinent to obesity diagnosis.   I have personally spent 30 minutes total time today in preparation, patient care, nutritional counseling and documentation for this visit, including the following: review of clinical lab tests; review of medical tests/procedures/services.      [name], DO DABFM, DABOM [facility] [address] [place], [place] [address] [phone]

## 2023-05-21 NOTE — Assessment & Plan Note (Signed)
Last vitamin D Lab Results  Component Value Date   VD25OH 26.3 (L) 01/09/2023   He is doing well on RX vitamin D weekly, sometimes forgetting to take Energy level slowly improving  Repeat vitamin D level next visit

## 2023-05-27 ENCOUNTER — Ambulatory Visit: Payer: 59 | Admitting: Gastroenterology

## 2023-05-27 ENCOUNTER — Encounter: Payer: Self-pay | Admitting: Gastroenterology

## 2023-05-27 VITALS — BP 124/80 | Ht 71.0 in | Wt 258.0 lb

## 2023-05-27 DIAGNOSIS — R748 Abnormal levels of other serum enzymes: Secondary | ICD-10-CM | POA: Diagnosis not present

## 2023-05-27 DIAGNOSIS — R195 Other fecal abnormalities: Secondary | ICD-10-CM

## 2023-05-27 NOTE — Patient Instructions (Addendum)
Please purchase the following medications over the counter and take as directed:  Benefiber  Follow up as needed.  _______________________________________________________  If your blood pressure at your visit was 140/90 or greater, please contact your primary care physician to follow up on this.  _______________________________________________________  If you are age 37 or older, your body mass index should be between 23-30. Your Body mass index is 35.98 kg/m. If this is out of the aforementioned range listed, please consider follow up with your Primary Care Provider.  If you are age 44 or younger, your body mass index should be between 19-25. Your Body mass index is 35.98 kg/m. If this is out of the aformentioned range listed, please consider follow up with your Primary Care Provider.   __________________________________________________________  The [place] [facility] providers would like to encourage you to use [facility] to communicate with providers for non-urgent requests or questions.  Due to long hold times on the telephone, sending your provider a message by [facility] may be a faster and more efficient way to get a response.  Please allow 48 business hours for a response.  Please remember that this is for non-urgent requests.   Due to recent changes in healthcare laws, you may see the results of your imaging and laboratory studies on MyChart before your provider has had a chance to review them.  We understand that in some cases there may be results that are confusing or concerning to you. Not all laboratory results come back in the same time frame and the provider may be waiting for multiple results in order to interpret others.  Please give [place] 48 hours in order for your provider to thoroughly review all the results before contacting the office for clarification of your results.     Due to recent changes in healthcare laws, you may see the results of your imaging and laboratory studies on MyChart  before your provider has had a chance to review them.  We understand that in some cases there may be results that are confusing or concerning to you. Not all laboratory results come back in the same time frame and the provider may be waiting for multiple results in order to interpret others.  Please give [place] 48 hours in order for your provider to thoroughly review all the results before contacting the office for clarification of your results.    Thank you for choosing me and [facility].  Vito Cirigliano, D.O.

## 2023-05-27 NOTE — Progress Notes (Signed)
Chief Complaint: Elevated liver enzymes   Referring Provider:     [name], DO    HPI:     Jonathan Hutchinson is a 37 y.o. male with a history of obesity referred to the [facility] for evaluation of elevated liver enzymes.  Follows with [facility] and has been intentionally losing weight slowly.  Liver enzymes checked for routine lab work in 01/2023 and notable for the following: - 01/09/2023: AST/ALT 88/48, normal T. bili, ALP, protein/albumin.  Normal renal function, B12, A1c  Since then liver enzymes have completely normalized: - 02/19/2023: AST/ALT 19/18 - 04/02/2023: AST/ALT 19/18  - 02/19/2023: RUQ [place]: Normal  Denies any medication changes since then. Other than MVI, no supplements, vitamins, etc.   Separately, has post prandial urgency with 3+ BM/day. Tends to be looser stools, but not diarrhea. Better with increased dietary fiber. Sxs have been present for multiple years. Avoids eating during the day to avoid sxs exacerbation.  Worse when he eats fast food and foods on the run while working.  Mother with gluten intolerance (no formal dx). Otherwise, no known family history of CRC, GI malignancy, liver disease, pancreatic disease, or IBD.     Past Medical History:  Diagnosis Date   Back pain    High blood pressure    Joint pain    Mixed hyperlipidemia    Ruptured disk    Sleep apnea      Past Surgical History:  Procedure Laterality Date   KNEE SURGERY Right    testical removal     Removed Left   Family History  Problem Relation Age of Onset   Hypertension Father    Hyperlipidemia Father    Social History   Tobacco Use   Smoking status: Former   Smokeless tobacco: Never  Substance Use Topics   Alcohol use: Yes   Drug use: No   Current Outpatient Medications  Medication Sig Dispense Refill   fenofibrate (TRICOR) 145 MG tablet Take 1 tablet (145 mg total) by mouth daily. 30 tablet 0   metFORMIN (GLUCOPHAGE-XR)  500 MG 24 hr tablet Take 1 tablet (500 mg total) by mouth daily with breakfast. 30 tablet 0   rosuvastatin (CRESTOR) 10 MG tablet Take 1 tablet (10 mg total) by mouth daily. 30 tablet 10   Vitamin D, Ergocalciferol, (DRISDOL) 1.25 MG (50000 UNIT) CAPS capsule Take 1 capsule (50,000 Units total) by mouth every 7 (seven) days. 12 capsule 0   No current facility-administered medications for this visit.   Allergies  Allergen Reactions   Diclofenac Swelling    Caused tongue swelling      Review of Systems: All systems reviewed and negative except where noted in HPI.     Physical Exam:    Wt Readings from Last 3 Encounters:  05/21/23 251 lb (113.9 kg)  03/17/23 246 lb (111.6 kg)  02/19/23 252 lb 4 oz (114.4 kg)    There were no vitals taken for this visit. Constitutional:  Pleasant, in no acute distress. Psychiatric: Normal mood and affect. Behavior is normal. Cardiovascular: Normal rate, regular rhythm. No edema Pulmonary/chest: Effort normal and breath sounds normal. No wheezing, rales or rhonchi. Abdominal: Soft, nondistended, nontender. Bowel sounds active throughout. There are no masses palpable. No hepatomegaly. Neurological: Alert and oriented to person place and time. Skin: Skin is warm and dry. No rashes noted.   ASSESSMENT AND PLAN;   1) Elevated  liver enzymes - Resolved - Discussed broad DDx for mildly elevated enzymes with rapid normalization - Planning on repeat liver enzymes during his routine annual wellness exam with Dr. [name].  If again normal, yearly liver enzyme check would be reasonable (since he is on hepatically filtered medication)  2) Variable bowel habits Seems dietary related.  Will treat conservatively to start - Start fiber supplement such as Benefiber or Citrucel - Increase dietary fiber and healthy eating - If symptoms persist, can plan on extended workup to include fecal pancreatic elastase, celiac panel, stool studies, +/- EGD and  colonoscopy with random and directed biopsies as appropriate   RTC prn    [name], DO, FACG  05/27/2023, 11:12 AM   [name], Scot Jun, DO

## 2023-05-28 ENCOUNTER — Telehealth (INDEPENDENT_AMBULATORY_CARE_PROVIDER_SITE_OTHER): Payer: Self-pay | Admitting: Family Medicine

## 2023-05-28 NOTE — Telephone Encounter (Signed)
Prior authorization done via cover my meds for patients Zepbound. Waiting on determination.   PA Case ID #: [id number]

## 2023-05-28 NOTE — Telephone Encounter (Signed)
Prior authorization approved for patients Zepbound.  Request Reference Number: [id number]. ZEPBOUND INJ 2.5MG  is approved through 11/28/2023.

## 2023-06-23 ENCOUNTER — Encounter: Payer: Self-pay | Admitting: Family Medicine

## 2023-06-23 ENCOUNTER — Ambulatory Visit: Payer: 59 | Admitting: Family Medicine

## 2023-06-23 VITALS — BP 131/86 | HR 84 | Temp 98.4°F | Ht 71.0 in | Wt 244.0 lb

## 2023-06-23 DIAGNOSIS — E781 Pure hyperglyceridemia: Secondary | ICD-10-CM | POA: Diagnosis not present

## 2023-06-23 DIAGNOSIS — E88819 Insulin resistance, unspecified: Secondary | ICD-10-CM

## 2023-06-23 DIAGNOSIS — Z6834 Body mass index (BMI) 34.0-34.9, adult: Secondary | ICD-10-CM

## 2023-06-23 DIAGNOSIS — E538 Deficiency of other specified B group vitamins: Secondary | ICD-10-CM

## 2023-06-23 DIAGNOSIS — E559 Vitamin D deficiency, unspecified: Secondary | ICD-10-CM

## 2023-06-23 MED ORDER — FENOFIBRATE 145 MG PO TABS
145.0000 mg | ORAL_TABLET | Freq: Every day | ORAL | 0 refills | Status: DC
Start: 2023-06-23 — End: 2023-07-28

## 2023-06-23 MED ORDER — VITAMIN D (ERGOCALCIFEROL) 1.25 MG (50000 UNIT) PO CAPS: 50000.0000 [IU] | ORAL_CAPSULE | ORAL | 0 refills | Status: DC

## 2023-06-23 MED ORDER — METFORMIN HCL ER 500 MG PO TB24
500.0000 mg | ORAL_TABLET | Freq: Every day | ORAL | 0 refills | Status: DC
Start: 2023-06-23 — End: 2023-07-28

## 2023-06-23 NOTE — Assessment & Plan Note (Addendum)
Vitamin B12 has improved.  He has been taking a multivitamin once daily. He denies paresthesias or extreme fatigue.  At risk for B12 deficiency with metformin intake.  Recheck B12 level today

## 2023-06-23 NOTE — Assessment & Plan Note (Signed)
Fasting insulin has been elevated but has improved over the past year from 32.8-19.6.  He is doing well on metformin XR 500 mg once daily with food.  He denies adverse side effects.  He has lost 16.1% of his total body weight in the past 16 months of medically supervised weight management fairly adherent to a reduced calorie diet lower in sugar and refined carbohydrates will focusing on lean protein and fiber with meals.  He is working to increase his level of baseline physical activity.  Continue working on increasing walking time and adding in weight training 2 days a week. Recheck labs today and continue metformin at 500 mg XR once daily

## 2023-06-23 NOTE — Assessment & Plan Note (Signed)
Lab Results  Component Value Date   CHOL 161 04/02/2023   HDL 31.40 (L) 04/02/2023   LDLCALC 95 01/09/2023   LDLDIRECT 89.0 04/02/2023   TRIG 283.0 (H) 04/02/2023   CHOLHDL 5 04/02/2023    He is doing well on fenofibrate 145 mg once daily which was added to his rosuvastatin 10 mg once daily.  He is working actively on weight reduction and a low saturated fat diet.  He has been working on increasing his exercise frequency.  He is tolerating fenofibrate well without adverse side effects.  Recheck labs today

## 2023-06-23 NOTE — Assessment & Plan Note (Signed)
Last vitamin D Lab Results  Component Value Date   VD25OH 26.3 (L) 01/09/2023   He has been taking vitamin D 50,000 IU once weekly.  He is due for a level today.  Target goal 50-70  Recheck vitamin D level today

## 2023-06-23 NOTE — Progress Notes (Signed)
Office: [phone]  /  Fax: [phone]  WEIGHT SUMMARY AND BIOMETRICS  Starting Date: 01/30/22  Starting Weight: 291lb   Weight Lost Since Last Visit: 7lb   Vitals Temp: 98.4 F (36.9 C) BP: 131/86 Pulse Rate: 84 SpO2: 97 %   Body Composition  Body Fat %: 27.9 % Fat Mass (lbs): 68.2 lbs Muscle Mass (lbs): 167.6 lbs Total Body Water (lbs): 121.4 lbs Visceral Fat Rating : 13    HPI  Chief Complaint: OBESITY  Jonathan Hutchinson is here to discuss his progress with his obesity treatment plan. He is on the the Category 4 Plan and states he is following his eating plan approximately 80 % of the time. He states he is exercising 0 minutes 0 times per week.   Interval History:  Since last office visit he is down 7 lb in the past month He hasn't been lifting weight as much lately He is down a net weight loss of 47 pounds in the past 16 months of medically supervised weight management This is a 16.1% total body weight loss His body fat has reduced by 7.4% His muscle mass is down 11.6 pounds which is a 24.6% of his total body weight loss He has not been tracking as often He is more likely to graze on snack on days off work  Pharmacotherapy: Metformin XR 500 mg once daily for IR  PHYSICAL EXAM:  Blood pressure 131/86, pulse 84, temperature 98.4 F (36.9 C), height 5\' 11"  (1.803 m), weight 244 lb (110.7 kg), SpO2 97%. Body mass index is 34.03 kg/m.  General: He is overweight, cooperative, alert, well developed, and in no acute distress. PSYCH: Has normal mood, affect and thought process.   Lungs: Normal breathing effort, no conversational dyspnea.   ASSESSMENT AND PLAN  TREATMENT PLAN FOR OBESITY:  Recommended Dietary Goals  Omi is currently in the action stage of change. As such, his goal is to continue weight management plan. He has agreed to keeping a food journal and adhering to recommended goals of 2200 calories and 150 g of protein.  Behavioral  Intervention  We discussed the following Behavioral Modification Strategies today: increasing lean protein intake, decreasing simple carbohydrates , increasing vegetables, increasing lower glycemic fruits, increasing fiber rich foods, increasing water intake, work on meal planning and preparation, work on Counselling psychologist calories using tracking application, keeping healthy foods at home, avoiding temptations and identifying enticing environmental cues, continue to practice mindfulness when eating, and planning for success.  Additional resources provided today: NA  Recommended Physical Activity Goals  Lamare has been advised to work up to 150 minutes of moderate intensity aerobic activity a week and strengthening exercises 2-3 times per week for cardiovascular health, weight loss maintenance and preservation of muscle mass.   He has agreed to [employer] strengthening exercises with a goal of 2-3 sessions a week  and Increase physical activity in their day and reduce sedentary time (increase NEAT).  Pharmacotherapy changes for the treatment of obesity: None  ASSOCIATED CONDITIONS ADDRESSED TODAY  B12 deficiency Assessment & Plan: Vitamin B12 has improved.  He has been taking a multivitamin once daily. He denies paresthesias or extreme fatigue.  At risk for B12 deficiency with metformin intake.  Recheck B12 level today  Orders: -     Vitamin B12  Vitamin D deficiency Assessment & Plan: Last vitamin D Lab Results  Component Value Date   VD25OH 26.3 (L) 01/09/2023   He has been taking vitamin D 50,000 IU once weekly.  He is due for a level today.  Target goal 50-70  Recheck vitamin D level today  Orders: -     Vitamin D (Ergocalciferol); Take 1 capsule (50,000 Units total) by mouth every 7 (seven) days.  Dispense: 12 capsule; Refill: 0 -     VITAMIN D 25 Hydroxy (Vit-D Deficiency, Fractures)  Insulin resistance Assessment & Plan: Fasting insulin has been elevated but has  improved over the past year from 32.8-19.6.  He is doing well on metformin XR 500 mg once daily with food.  He denies adverse side effects.  He has lost 16.1% of his total body weight in the past 16 months of medically supervised weight management fairly adherent to a reduced calorie diet lower in sugar and refined carbohydrates will focusing on lean protein and fiber with meals.  He is working to increase his level of baseline physical activity.  Continue working on increasing walking time and adding in weight training 2 days a week. Recheck labs today and continue metformin at 500 mg XR once daily  Orders: -     metFORMIN HCl ER; Take 1 tablet (500 mg total) by mouth daily with breakfast.  Dispense: 30 tablet; Refill: 0  Hypertriglyceridemia Assessment & Plan: Lab Results  Component Value Date   CHOL 161 04/02/2023   HDL 31.40 (L) 04/02/2023   LDLCALC 95 01/09/2023   LDLDIRECT 89.0 04/02/2023   TRIG 283.0 (H) 04/02/2023   CHOLHDL 5 04/02/2023    He is doing well on fenofibrate 145 mg once daily which was added to his rosuvastatin 10 mg once daily.  He is working actively on weight reduction and a low saturated fat diet.  He has been working on increasing his exercise frequency.  He is tolerating fenofibrate well without adverse side effects.  Recheck labs today  Orders: -     Fenofibrate; Take 1 tablet (145 mg total) by mouth daily.  Dispense: 30 tablet; Refill: 0 -     Lipid panel -     Comprehensive metabolic panel  Morbid obesity (HCC) with starting BMI 40.6  BMI 34.0-34.9,adult      He was informed of the importance of frequent follow up visits to maximize his success with intensive lifestyle modifications for his multiple health conditions.   ATTESTASTION STATEMENTS:  Reviewed by clinician on day of visit: allergies, medications, problem list, medical history, surgical history, family history, social history, and previous encounter notes pertinent to obesity  diagnosis.   I have personally spent 30 minutes total time today in preparation, patient care, nutritional counseling and documentation for this visit, including the following: review of clinical lab tests; review of medical tests/procedures/services.      [name], DO DABFM, DABOM [facility] [address] [place], [place] [address] [phone]

## 2023-06-24 LAB — COMPREHENSIVE METABOLIC PANEL
ALT: 19 IU/L (ref 0–44)
AST: 19 IU/L (ref 0–40)
Albumin: 4.6 g/dL (ref 4.1–5.1)
Alkaline Phosphatase: 61 IU/L (ref 44–121)
BUN/Creatinine Ratio: 16 (ref 9–20)
BUN: 15 mg/dL (ref 6–20)
Bilirubin Total: 0.8 mg/dL (ref 0.0–1.2)
CO2: 21 mmol/L (ref 20–29)
Calcium: 9.1 mg/dL (ref 8.7–10.2)
Chloride: 106 mmol/L (ref 96–106)
Creatinine, Ser: 0.92 mg/dL (ref 0.76–1.27)
Globulin, Total: 1.9 g/dL (ref 1.5–4.5)
Glucose: 93 mg/dL (ref 70–99)
Potassium: 3.9 mmol/L (ref 3.5–5.2)
Sodium: 143 mmol/L (ref 134–144)
Total Protein: 6.5 g/dL (ref 6.0–8.5)
eGFR: 110 mL/min/{1.73_m2} (ref >59)

## 2023-06-24 LAB — LIPID PANEL
Chol/HDL Ratio: 4.8 ratio (ref 0.0–5.0)
Cholesterol, Total: 148 mg/dL (ref 100–199)
HDL: 31 mg/dL — ABNORMAL LOW (ref >39)
LDL Chol Calc (NIH): 75 mg/dL (ref 0–99)
Triglycerides: 255 mg/dL — ABNORMAL HIGH (ref 0–149)
VLDL Cholesterol Cal: 42 mg/dL — ABNORMAL HIGH (ref 5–40)

## 2023-06-24 LAB — VITAMIN D 25 HYDROXY (VIT D DEFICIENCY, FRACTURES): Vit D, 25-Hydroxy: 31 ng/mL (ref 30.0–100.0)

## 2023-06-24 LAB — VITAMIN B12: Vitamin B-12: 339 pg/mL (ref 232–1245)

## 2023-07-28 ENCOUNTER — Encounter: Payer: Self-pay | Admitting: Family Medicine

## 2023-07-28 ENCOUNTER — Ambulatory Visit: Payer: 59 | Admitting: Family Medicine

## 2023-07-28 VITALS — BP 136/88 | HR 91 | Temp 98.6°F | Ht 71.0 in | Wt 250.0 lb

## 2023-07-28 DIAGNOSIS — E88819 Insulin resistance, unspecified: Secondary | ICD-10-CM

## 2023-07-28 DIAGNOSIS — E66811 Obesity, class 1: Secondary | ICD-10-CM

## 2023-07-28 DIAGNOSIS — E6609 Other obesity due to excess calories: Secondary | ICD-10-CM

## 2023-07-28 DIAGNOSIS — E781 Pure hyperglyceridemia: Secondary | ICD-10-CM | POA: Diagnosis not present

## 2023-07-28 DIAGNOSIS — E538 Deficiency of other specified B group vitamins: Secondary | ICD-10-CM

## 2023-07-28 DIAGNOSIS — Z6834 Body mass index (BMI) 34.0-34.9, adult: Secondary | ICD-10-CM

## 2023-07-28 DIAGNOSIS — E559 Vitamin D deficiency, unspecified: Secondary | ICD-10-CM | POA: Diagnosis not present

## 2023-07-28 MED ORDER — METFORMIN HCL ER 500 MG PO TB24: 500.0000 mg | ORAL_TABLET | Freq: Every day | ORAL | 1 refills | Status: DC

## 2023-07-28 MED ORDER — VITAMIN D (ERGOCALCIFEROL) 1.25 MG (50000 UNIT) PO CAPS: 50000.0000 [IU] | ORAL_CAPSULE | ORAL | 0 refills | Status: DC

## 2023-07-28 MED ORDER — FENOFIBRATE 145 MG PO TABS: 145.0000 mg | ORAL_TABLET | Freq: Every day | ORAL | 1 refills | Status: DC

## 2023-07-28 NOTE — Progress Notes (Signed)
Office: [phone]  /  Fax: [phone]  WEIGHT SUMMARY AND BIOMETRICS  Starting Date: 01/30/22  Starting Weight: 291lb   Weight Lost Since Last Visit: 0lb   Vitals Temp: 98.6 F (37 C) BP: 136/88 Pulse Rate: 91 SpO2: 97 %   Body Composition  Body Fat %: 28.8 % Fat Mass (lbs): 72 lbs Muscle Mass (lbs): 169.4 lbs Total Body Water (lbs): 123 lbs Visceral Fat Rating : 13     HPI  Chief Complaint: OBESITY  Jonathan Hutchinson is here to discuss his progress with his obesity treatment plan. He is on the the Category 4 Plan and states he is following his eating plan approximately 0 % of the time. He states he is exercising 0 minutes 0 times per week.   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 This has lead to more missing lunches  He has had to travel more for work and is not sleeping as much There are sweets at home He has not had as much time for workouts  He did gain 1.8 pounds of muscle mass and gained 3.8 pounds of body fat in the past month He has a net weight loss of 41 pounds in the past 17 months of medically supervised weight management  Pharmacotherapy: Metformin XR 500 mg once daily for insulin resistance, has not been taking for 1 month  PHYSICAL EXAM:  Blood pressure 136/88, pulse 91, temperature 98.6 F (37 C), height 5\' 11"  (1.803 m), weight 250 lb (113.4 kg), SpO2 97%. Body mass index is 34.87 kg/m.  General: He is overweight, cooperative, alert, well developed, and in no acute distress. PSYCH: Has normal mood, affect and thought process.   Lungs: Normal breathing effort, no conversational dyspnea.   ASSESSMENT AND PLAN  TREATMENT PLAN FOR OBESITY:  Recommended Dietary Goals  Kahleil is currently in the action stage of change. As such, his goal is to continue weight management plan. He has agreed to the Category 4 Plan. -We discussed keeping a protein shake,  protein bar and fresh fruit at work on the days he cannot get in a full lunch meal.  Behavioral Intervention  We discussed the following Behavioral Modification Strategies today: increasing lean protein intake to established goals, increasing lower glycemic fruits, increasing water intake , work on meal planning and preparation, work on tracking and journaling calories using tracking application, keeping healthy foods at home, identifying sources and decreasing liquid calories, planning for success, better snacking choices, and continue to work on maintaining a reduced calorie state, getting the recommended amount of protein, incorporating whole foods, making healthy choices, staying well hydrated and practicing mindfulness when eating..  Additional resources provided today: NA  Recommended Physical Activity Goals  Fermin has been advised to work up to 150 minutes of moderate intensity aerobic activity a week and strengthening exercises 2-3 times per week for cardiovascular health, weight loss maintenance and preservation of muscle mass.   He has agreed to [employer] strengthening exercises with a goal of 2-3 sessions a week   Pharmacotherapy changes for the treatment of obesity: None  ASSOCIATED CONDITIONS ADDRESSED TODAY  Vitamin D deficiency Assessment & Plan: Last vitamin D Lab Results  Component Value Date   VD25OH 31.0 06/23/2023   Reviewed lab results with patient.  His vitamin D level has come up slightly but he reports poor compliance in taking his prescription vitamin D every week.  His energy levels remain fairly low.  Will resume vitamin D 50,000 IU once weekly.  Plan to recheck level in 4 months  Orders: -     Vitamin D (Ergocalciferol); Take 1 capsule (50,000 Units total) by mouth every 7 (seven) days.  Dispense: 12 capsule; Refill: 0  Hypertriglyceridemia Assessment & Plan: Lab Results  Component Value Date   CHOL 148 06/23/2023   HDL 31 (L) 06/23/2023   LDLCALC 75  06/23/2023   LDLDIRECT 89.0 04/02/2023   TRIG 255 (H) 06/23/2023   CHOLHDL 4.8 06/23/2023    He is doing well on Tricor 145 mg daily and rosuvastatin 10 mg once daily.  He is tolerating both well.  He has struggled to remember to take his medications on a daily basis due to a high stress job and long work hours.  He has been working on his prescribed dietary plan, venturing off plan more recently.  Continue to work on a low saturated fat diet.  Plan increase exercise frequency to 2-3 times a week.  Continue current medications.  Orders: -     Fenofibrate; Take 1 tablet (145 mg total) by mouth daily.  Dispense: 30 tablet; Refill: 1  Insulin resistance Assessment & Plan: Improving.  He has seen improvements in his fasting insulin with dietary change, weight loss, more physical activity and use of metformin XR 500 mg once daily with food.  He has been inconsistent taking his metformin for the past month.  He has been less physically active due to working longer hours.  We discussed getting back to both cardio and resistance training exercises and resuming use of metformin XR 500 mg once daily.  Reviewed lab results from last visit.  Orders: -     metFORMIN HCl ER; Take 1 tablet (500 mg total) by mouth daily with breakfast.  Dispense: 30 tablet; Refill: 1  Class 1 obesity due to excess calories with serious comorbidity and body mass index (BMI) of 34.0 to 34.9 in adult  B12 deficiency Assessment & Plan: Lab Results  Component Value Date   VITAMINB12 339 06/23/2023   Reviewed lab results from last visit.  His vitamin D level is still in the low normal range.  He has not been taking a men's multivitamin or B12 supplement.  He does not consume a vegan diet.  Recommend starting in men's multivitamin once daily.       He was informed of the importance of frequent follow up visits to maximize his success with intensive lifestyle modifications for his multiple health  conditions.   ATTESTASTION STATEMENTS:  Reviewed by clinician on day of visit: allergies, medications, problem list, medical history, surgical history, family history, social history, and previous encounter notes pertinent to obesity diagnosis.   I have personally spent 30 minutes total time today in preparation, patient care, nutritional counseling and documentation for this visit, including the following: review of clinical lab tests; review of medical tests/procedures/services.      [name], DO DABFM, DABOM [facility] [address] [place], [place] [address] [phone]

## 2023-07-28 NOTE — Assessment & Plan Note (Signed)
Lab Results  Component Value Date   CHOL 148 06/23/2023   HDL 31 (L) 06/23/2023   LDLCALC 75 06/23/2023   LDLDIRECT 89.0 04/02/2023   TRIG 255 (H) 06/23/2023   CHOLHDL 4.8 06/23/2023    He is doing well on Tricor 145 mg daily and rosuvastatin 10 mg once daily.  He is tolerating both well.  He has struggled to remember to take his medications on a daily basis due to a high stress job and long work hours.  He has been working on his prescribed dietary plan, venturing off plan more recently.  Continue to work on a low saturated fat diet.  Plan increase exercise frequency to 2-3 times a week.  Continue current medications.

## 2023-07-28 NOTE — Patient Instructions (Signed)
Begin a Men's Multivitamin once a day  Continue Vitamin D 50,000 international units  once a week  Continue current medications  Try to bring protein shakes/ protein bars/ fresh fruit to work instead of skipping lunches

## 2023-07-28 NOTE — Assessment & Plan Note (Signed)
Improving.  He has seen improvements in his fasting insulin with dietary change, weight loss, more physical activity and use of metformin XR 500 mg once daily with food.  He has been inconsistent taking his metformin for the past month.  He has been less physically active due to working longer hours.  We discussed getting back to both cardio and resistance training exercises and resuming use of metformin XR 500 mg once daily.  Reviewed lab results from last visit.

## 2023-07-28 NOTE — Assessment & Plan Note (Signed)
Last vitamin D Lab Results  Component Value Date   VD25OH 31.0 06/23/2023   Reviewed lab results with patient.  His vitamin D level has come up slightly but he reports poor compliance in taking his prescription vitamin D every week.  His energy levels remain fairly low.  Will resume vitamin D 50,000 IU once weekly.  Plan to recheck level in 4 months

## 2023-07-28 NOTE — Assessment & Plan Note (Signed)
Lab Results  Component Value Date   VITAMINB12 339 06/23/2023   Reviewed lab results from last visit.  His vitamin D level is still in the low normal range.  He has not been taking a men's multivitamin or B12 supplement.  He does not consume a vegan diet.  Recommend starting in men's multivitamin once daily.

## 2023-08-26 ENCOUNTER — Encounter: Payer: 59 | Admitting: Family Medicine

## 2023-09-11 ENCOUNTER — Encounter: Payer: Self-pay | Admitting: Family Medicine

## 2023-09-11 ENCOUNTER — Ambulatory Visit: Payer: 59 | Admitting: Family Medicine

## 2023-09-11 VITALS — BP 135/85 | HR 75 | Temp 98.0°F | Ht 71.0 in | Wt 249.0 lb

## 2023-09-11 DIAGNOSIS — E88819 Insulin resistance, unspecified: Secondary | ICD-10-CM

## 2023-09-11 DIAGNOSIS — Z6834 Body mass index (BMI) 34.0-34.9, adult: Secondary | ICD-10-CM

## 2023-09-11 DIAGNOSIS — E559 Vitamin D deficiency, unspecified: Secondary | ICD-10-CM | POA: Diagnosis not present

## 2023-09-11 DIAGNOSIS — E66811 Obesity, class 1: Secondary | ICD-10-CM | POA: Diagnosis not present

## 2023-09-11 DIAGNOSIS — E781 Pure hyperglyceridemia: Secondary | ICD-10-CM | POA: Diagnosis not present

## 2023-09-11 DIAGNOSIS — E6609 Other obesity due to excess calories: Secondary | ICD-10-CM

## 2023-09-11 MED ORDER — FENOFIBRATE 145 MG PO TABS: 145.0000 mg | ORAL_TABLET | Freq: Every day | ORAL | 1 refills | Status: DC

## 2023-09-11 MED ORDER — VITAMIN D (ERGOCALCIFEROL) 1.25 MG (50000 UNIT) PO CAPS: 50000.0000 [IU] | ORAL_CAPSULE | ORAL | 0 refills | Status: DC

## 2023-09-11 MED ORDER — METFORMIN HCL ER 500 MG PO TB24: 500.0000 mg | ORAL_TABLET | Freq: Every day | ORAL | 1 refills | Status: DC

## 2023-09-11 NOTE — Patient Instructions (Signed)
Aim for 2000 - 2200 cal/ day This should include 120+ g of protein per day

## 2023-09-11 NOTE — Progress Notes (Signed)
Office: [phone]  /  Fax: [phone]  WEIGHT SUMMARY AND BIOMETRICS  Starting Date: 01/30/22  Starting Weight: 291lb   Weight Lost Since Last Visit: 1lb   Vitals Temp: 98 F (36.7 C) BP: 135/85 Pulse Rate: 75 SpO2: 98 %   Body Composition  Body Fat %: 28.7 % Fat Mass (lbs): 71.6 lbs Muscle Mass (lbs): 169.2 lbs Total Body Water (lbs): 125.2 lbs Visceral Fat Rating : 13   HPI  Chief Complaint: OBESITY  Jonathan Hutchinson is here to discuss his progress with his obesity treatment plan. He is on the the Category 4 Plan and states he is following his eating plan approximately 60 % of the time. He states he is exercising 0 minutes 0 times per week.   Interval History:  Since last office visit he is down 1 lb he has a net weight loss of 42 lb in the past 1.5 years of medically supervised weight management He is tracking steps but has not been able to get to the gym He would like to lose another 25 lb He will have more time to workout with work schedule soon He will be change to first shift He is at times triggered to make poor food choices around his fiance  Pharmacotherapy: metformin XR 500 mg daily  PHYSICAL EXAM:  Blood pressure 135/85, pulse 75, temperature 98 F (36.7 C), height 5\' 11"  (1.803 m), weight 249 lb (112.9 kg), SpO2 98%. Body mass index is 34.73 kg/m.  General: He is overweight, cooperative, alert, well developed, and in no acute distress. PSYCH: Has normal mood, affect and thought process.   Lungs: Normal breathing effort, no conversational dyspnea.   ASSESSMENT AND PLAN  TREATMENT PLAN FOR OBESITY:  Recommended Dietary Goals  Toru is currently in the action stage of change. As such, his goal is to continue weight management plan. He has agreed to keeping a food journal and adhering to recommended goals of 2000-2200 calories and 120+ g of  protein.  Behavioral Intervention  We discussed the following Behavioral Modification Strategies  today: increasing lean protein intake to established goals, increasing fiber rich foods, avoiding skipping meals, increasing water intake , work on meal planning and preparation, work on Counselling psychologist calories using tracking application, keeping healthy foods at home, identifying sources and decreasing liquid calories, work on managing stress, creating time for self-care and relaxation, planning for success, and continue to work on maintaining a reduced calorie state, getting the recommended amount of protein, incorporating whole foods, making healthy choices, staying well hydrated and practicing mindfulness when eating..  Additional resources provided today: NA  Recommended Physical Activity Goals  Hillis has been advised to work up to 150 minutes of moderate intensity aerobic activity a week and strengthening exercises 2-3 times per week for cardiovascular health, weight loss maintenance and preservation of muscle mass.   He has agreed to [employer] strengthening exercises with a goal of 2-3 sessions a week  - and track daily steps with a goal > 8,000 steps/day  Pharmacotherapy changes for the treatment of obesity: none  ASSOCIATED CONDITIONS ADDRESSED TODAY  Insulin resistance Assessment & Plan: Improving with weight reduction, lifestyle changes and use of metformin XR 500 mg daily Denies adverse SE  WILL ASK PCP TO RE-DRAW HIS FASTING INSULIN WITH UPCOMING LABS If improved to <15, will discontinue metformin We reviewed a plan to reduce excess sugar and increase exercise time  Orders: -     metFORMIN HCl ER; Take 1 tablet (500 mg  total) by mouth daily with breakfast.  Dispense: 30 tablet; Refill: 1  Hypertriglyceridemia -     Fenofibrate; Take 1 tablet (145 mg total) by mouth daily.  Dispense: 30 tablet; Refill: 1  Vitamin D deficiency Assessment & Plan: Last vitamin D Lab Results  Component Value Date   VD25OH 31.0 06/23/2023   Taking RX D weekly with a goal level >  50 Energy level stable  Recheck level in the next month -- can be drawn by PCP  Orders: -     Vitamin D (Ergocalciferol); Take 1 capsule (50,000 Units total) by mouth every 7 (seven) days.  Dispense: 12 capsule; Refill: 0  Class 1 obesity due to excess calories with body mass index (BMI) of 34.0 to 34.9 in adult, unspecified whether serious comorbidity present      He was informed of the importance of frequent follow up visits to maximize his success with intensive lifestyle modifications for his multiple health conditions.   ATTESTASTION STATEMENTS:  Reviewed by clinician on day of visit: allergies, medications, problem list, medical history, surgical history, family history, social history, and previous encounter notes pertinent to obesity diagnosis.   I have personally spent 30 minutes total time today in preparation, patient care, nutritional counseling and documentation for this visit, including the following: review of clinical lab tests; review of medical tests/procedures/services.      [name], DO DABFM, DABOM [facility] [address] [place], [place] [address] [phone]

## 2023-09-11 NOTE — Assessment & Plan Note (Signed)
Last vitamin D Lab Results  Component Value Date   VD25OH 31.0 06/23/2023   Taking RX D weekly with a goal level > 50 Energy level stable  Recheck level in the next month -- can be drawn by PCP

## 2023-09-11 NOTE — Assessment & Plan Note (Signed)
Improving with weight reduction, lifestyle changes and use of metformin XR 500 mg daily Denies adverse SE  WILL ASK PCP TO RE-DRAW HIS FASTING INSULIN WITH UPCOMING LABS If improved to <15, will discontinue metformin We reviewed a plan to reduce excess sugar and increase exercise time

## 2023-09-19 ENCOUNTER — Encounter: Payer: 59 | Admitting: Family Medicine

## 2023-09-22 ENCOUNTER — Encounter: Payer: Self-pay | Admitting: Family Medicine

## 2023-09-22 ENCOUNTER — Ambulatory Visit (INDEPENDENT_AMBULATORY_CARE_PROVIDER_SITE_OTHER): Payer: 59 | Admitting: Family Medicine

## 2023-09-22 VITALS — BP 110/80 | HR 92 | Temp 98.0°F | Resp 16 | Ht 71.0 in | Wt 253.2 lb

## 2023-09-22 DIAGNOSIS — Z0000 Encounter for general adult medical examination without abnormal findings: Secondary | ICD-10-CM

## 2023-09-22 DIAGNOSIS — E782 Mixed hyperlipidemia: Secondary | ICD-10-CM | POA: Diagnosis not present

## 2023-09-22 MED ORDER — ROSUVASTATIN CALCIUM 10 MG PO TABS: 10.0000 mg | ORAL_TABLET | Freq: Every day | ORAL | 10 refills | Status: AC

## 2023-09-22 NOTE — Progress Notes (Signed)
Chief Complaint  Patient presents with   Annual Exam    Annual Exam    Well Male Jonathan Hutchinson is here for a complete physical.   His last physical was >1 year ago.  Current diet: in general, diet is OK.   Current exercise: none Weight trend: up a few lbs Fatigue out of ordinary? No. Seat belt? Yes.   Advanced directive? No  Health maintenance Tetanus- Yes HIV- Yes Hep C- Yes  Past Medical History:  Diagnosis Date   Back pain    High blood pressure    Joint pain    Mixed hyperlipidemia    Ruptured disk    Sleep apnea      Past Surgical History:  Procedure Laterality Date   KNEE SURGERY Right    testical removal     Removed Left    Medications  Current Outpatient Medications on File Prior to Visit  Medication Sig Dispense Refill   fenofibrate (TRICOR) 145 MG tablet Take 1 tablet (145 mg total) by mouth daily. 30 tablet 1   metFORMIN (GLUCOPHAGE-XR) 500 MG 24 hr tablet Take 1 tablet (500 mg total) by mouth daily with breakfast. 30 tablet 1   Vitamin D, Ergocalciferol, (DRISDOL) 1.25 MG (50000 UNIT) CAPS capsule Take 1 capsule (50,000 Units total) by mouth every 7 (seven) days. 12 capsule 0    Allergies Allergies  Allergen Reactions   Diclofenac Swelling    Caused tongue swelling     Family History Family History  Problem Relation Age of Onset   Hypertension Father    Hyperlipidemia Father     Review of Systems: Constitutional: no fevers or chills Eye:  no recent significant change in vision Ear/Nose/Mouth/Throat:  Ears:  no hearing loss Nose/Mouth/Throat:  no complaints of nasal congestion, no sore throat Cardiovascular:  no chest pain Respiratory:  no shortness of breath Gastrointestinal:  no abdominal pain, no change in bowel habits GU:  Male: negative for dysuria Musculoskeletal/Extremities:  no pain of the joints Integumentary (Skin/Breast):  no abnormal skin lesions reported Neurologic:  no headaches Endocrine: No unexpected weight  changes Hematologic/Lymphatic:  no night sweats  Exam BP 110/80   Pulse 92   Temp 98 F (36.7 C) (Oral)   Resp 16   Ht 5\' 11"  (1.803 m)   Wt 253 lb 3.2 oz (114.9 kg)   SpO2 95%   BMI 35.31 kg/m  General:  well developed, well nourished, in no apparent distress Skin:  no significant moles, warts, or growths Head:  no masses, lesions, or tenderness Eyes:  pupils equal and round, sclera anicteric without injection Ears:  canals without lesions, TMs shiny without retraction, no obvious effusion, no erythema Nose:  nares patent, mucosa normal Throat/Pharynx:  lips and gingiva without lesion; tongue and uvula midline; non-inflamed pharynx; no exudates or postnasal drainage Neck: neck supple without adenopathy, thyromegaly, or masses Lungs:  clear to auscultation, breath sounds equal bilaterally, no respiratory distress Cardio:  regular rate and rhythm, no bruits, no LE edema Abdomen:  abdomen soft, nontender; bowel sounds normal; no masses or organomegaly Genital (male): Deferred Rectal: Deferred Musculoskeletal:  symmetrical muscle groups noted without atrophy or deformity Extremities:  no clubbing, cyanosis, or edema, no deformities, no skin discoloration Neuro:  gait normal; deep tendon reflexes normal and symmetric Psych: well oriented with normal range of affect and appropriate judgment/insight  Assessment and Plan  Well adult exam - Plan: CBC, Comprehensive metabolic panel, Lipid panel  Mixed hyperlipidemia - Plan: rosuvastatin (CRESTOR) 10  MG tablet   Well 37 y.o. male. Counseled on diet and exercise. Self testicular exams recommended at least monthly.  Advanced directive form provided today.  Flu shot politely declined. Other orders as above. Follow up in 6 mo pending the above workup. The patient voiced understanding and agreement to the plan.  Jilda Roche N[place], DO 09/22/23 1:39 PM

## 2023-09-22 NOTE — Patient Instructions (Addendum)
Give [place] 2-3 business days to get the results of your labs back.   Keep the diet clean and stay active.  Do monthly self testicular checks in the shower. You are feeling for lumps/bumps that don't belong. If you feel anything like this, let me know!  Please get me a copy of your advanced directive form at your convenience.   Let us know if you need anything.

## 2023-09-23 ENCOUNTER — Other Ambulatory Visit: Payer: Self-pay

## 2023-09-23 DIAGNOSIS — E781 Pure hyperglyceridemia: Secondary | ICD-10-CM

## 2023-09-23 LAB — CBC
HCT: 45.8 % (ref 39.0–52.0)
Hemoglobin: 15.7 g/dL (ref 13.0–17.0)
MCHC: 34.2 g/dL (ref 30.0–36.0)
MCV: 87.3 fL (ref 78.0–100.0)
Platelets: 262 10*3/uL (ref 150.0–400.0)
RBC: 5.25 Mil/uL (ref 4.22–5.81)
RDW: 13.3 % (ref 11.5–15.5)
WBC: 8.5 10*3/uL (ref 4.0–10.5)

## 2023-09-23 LAB — COMPREHENSIVE METABOLIC PANEL
ALT: 18 U/L (ref 0–53)
AST: 16 U/L (ref 0–37)
Albumin: 4.7 g/dL (ref 3.5–5.2)
Alkaline Phosphatase: 52 U/L (ref 39–117)
BUN: 13 mg/dL (ref 6–23)
CO2: 27 meq/L (ref 19–32)
Calcium: 8.9 mg/dL (ref 8.4–10.5)
Chloride: 105 meq/L (ref 96–112)
Creatinine, Ser: 0.85 mg/dL (ref 0.40–1.50)
GFR: 110.67 mL/min (ref >60.00)
Glucose, Bld: 81 mg/dL (ref 70–99)
Potassium: 3.7 meq/L (ref 3.5–5.1)
Sodium: 142 meq/L (ref 135–145)
Total Bilirubin: 1 mg/dL (ref 0.2–1.2)
Total Protein: 6.8 g/dL (ref 6.0–8.3)

## 2023-09-23 LAB — LIPID PANEL
Cholesterol: 190 mg/dL (ref 0–200)
HDL: 33.1 mg/dL — ABNORMAL LOW (ref >39.00)
Total CHOL/HDL Ratio: 6
Triglycerides: 508 mg/dL — ABNORMAL HIGH (ref 0.0–149.0)

## 2023-09-23 LAB — LDL CHOLESTEROL, DIRECT: Direct LDL: 81 mg/dL

## 2023-10-16 ENCOUNTER — Ambulatory Visit: Payer: 59 | Admitting: Family Medicine

## 2023-11-06 ENCOUNTER — Ambulatory Visit: Payer: 59 | Admitting: Family Medicine

## 2023-11-06 ENCOUNTER — Encounter: Payer: Self-pay | Admitting: Family Medicine

## 2023-11-06 VITALS — BP 128/83 | HR 96 | Temp 98.6°F | Ht 71.0 in | Wt 253.0 lb

## 2023-11-06 DIAGNOSIS — E559 Vitamin D deficiency, unspecified: Secondary | ICD-10-CM | POA: Diagnosis not present

## 2023-11-06 DIAGNOSIS — E66812 Obesity, class 2: Secondary | ICD-10-CM | POA: Diagnosis not present

## 2023-11-06 DIAGNOSIS — E88819 Insulin resistance, unspecified: Secondary | ICD-10-CM

## 2023-11-06 DIAGNOSIS — E782 Mixed hyperlipidemia: Secondary | ICD-10-CM

## 2023-11-06 DIAGNOSIS — Z6835 Body mass index (BMI) 35.0-35.9, adult: Secondary | ICD-10-CM

## 2023-11-06 MED ORDER — VITAMIN D (ERGOCALCIFEROL) 1.25 MG (50000 UNIT) PO CAPS: 50000.0000 [IU] | ORAL_CAPSULE | ORAL | 0 refills | Status: DC

## 2023-11-06 NOTE — Progress Notes (Signed)
Office: [phone]  /  Fax: [phone]  WEIGHT SUMMARY AND BIOMETRICS  Starting Date: 01/30/22  Starting Weight: 291lb   Weight Lost Since Last Visit: 0lb   Vitals Temp: 98.6 F (37 C) BP: 128/83 Pulse Rate: 96 SpO2: 97 %   Body Composition  Body Fat %: 29.1 % Fat Mass (lbs): 73.8 lbs Muscle Mass (lbs): 170.8 lbs Total Body Water (lbs): 126.2 lbs Visceral Fat Rating : 14    HPI  Chief Complaint: OBESITY  Jonathan Hutchinson is here to discuss his progress with his obesity treatment plan. He is on the the Category 4 Plan and states he is following his eating plan approximately 70 % of the time. He states he is exercising 60 minutes 1-2 times per week.  Interval History:  Since last office visit he is up 4 lb He is up 1.6 lb of muscle mass and is up 2.2 lb of body fat since last visit This gives him a net weight loss of 38 lb in the past 1.5 years of medically supervised weight management This is a 13% TBW loss He has changed to first shift and is sleeping better He has not been taking his Multivitamin daily He is back into the gym doing cardio-- started last week He is back to logging his calorie intake and has been trying to do better taking his meds and vitamins  Pharmacotherapy: metformin XR 500 mg once daily  PHYSICAL EXAM:  Blood pressure 128/83, pulse 96, temperature 98.6 F (37 C), height 5\' 11"  (1.803 m), weight 253 lb (114.8 kg), SpO2 97%. Body mass index is 35.29 kg/m.  General: He is overweight, cooperative, alert, well developed, and in no acute distress. PSYCH: Has normal mood, affect and thought process.   Lungs: Normal breathing effort, no conversational dyspnea.   ASSESSMENT AND PLAN  TREATMENT PLAN FOR OBESITY:  Recommended Dietary Goals  Keyion is currently in the action stage of change. As such, his goal is to continue weight management plan. He has agreed to keeping a food journal and adhering to recommended goals of 2000-2200 calories and  130 g of  protein. Reviewed easy lunch options to bring to work Reviewed pre mixed protein shake options Reviewed protein bar brands.  Recommend having one in the afternoon for a snack (pre- workout)  Behavioral Intervention  We discussed the following Behavioral Modification Strategies today: increasing lean protein intake to established goals, increasing fiber rich foods, increasing water intake , work on meal planning and preparation, work on tracking and journaling calories using tracking application, keeping healthy foods at home, identifying sources and decreasing liquid calories, continue to practice mindfulness when eating, planning for success, and continue to work on maintaining a reduced calorie state, getting the recommended amount of protein, incorporating whole foods, making healthy choices, staying well hydrated and practicing mindfulness when eating..  Additional resources provided today: NA  Recommended Physical Activity Goals  Jvion has been advised to work up to 150 minutes of moderate intensity aerobic activity a week and strengthening exercises 2-3 times per week for cardiovascular health, weight loss maintenance and preservation of muscle mass.   He has agreed to Start aerobic activity with a goal of 150 minutes a week at moderate intensity.  Gym goal: cardio at the gym to start 1 hr 3 x a week  Pharmacotherapy changes for the treatment of obesity: none  ASSOCIATED CONDITIONS ADDRESSED TODAY  Vitamin D deficiency Assessment & Plan: Last vitamin D Lab Results  Component Value Date  VD25OH 31.0 06/23/2023   He reports poor compliance taking RX vitamin D weekly He plans to improve his compliance in taking this and his Men's MVI daily  Repeat lab in 6 weeks  Orders: -     Vitamin D (Ergocalciferol); Take 1 capsule (50,000 Units total) by mouth every 7 (seven) days.  Dispense: 12 capsule; Refill: 0  Class 2 severe obesity due to excess calories with serious  comorbidity and body mass index (BMI) of 35.0 to 35.9 in adult [facility]) Assessment & Plan: Reviewed patient's overall progress with a 13% TBW loss in the past 1.5 years of medically supervisied weight management He has metabolic sequelae of dyslipidemia and insulin resistance His motivation to continue to work on healthy lifestyle changes remains  Plan: Improve compliance with dietary logging + gym workouts 3 days/ wk Consider AOM next visit if not seeing further results   Mixed hyperlipidemia Assessment & Plan: Lab Results  Component Value Date   CHOL 190 09/22/2023   HDL 33.10 (L) 09/22/2023   LDLCALC 75 06/23/2023   LDLDIRECT 81.0 09/22/2023   TRIG (H) 09/22/2023    508.0 Triglyceride is over 400; calculations on Lipids are invalid.   CHOLHDL 6 09/22/2023   Reviewed his labs drawn by PCP above He reports poor compliance taking his Rosuvastatin and Fenofibrate Now that his shift work is stable and his sleep schedule has improved, he is making an effort to take all of his medicine daily as directed  Continue to work on a low saturated fat diet and weight reduction Continue to work on improving compliance taking medications   Insulin resistance Assessment & Plan: Last fasting insulin level improved from 32.8 to 19.6 with lifestyle changes + use of metformin XR 500 mg once daily He is tolerating metformin well without adverse SE but admits to poor compliance taking it the past 2-3 mos  Plan: Work on compliance taking metformin XR 500 mg once daily (adding to a pill box can help) Continue to work on a reduced kcal low sugar diet + increasing daily steps Plan to add in weight training by Spring Consider increasing metformin dose next visit       He was informed of the importance of frequent follow up visits to maximize his success with intensive lifestyle modifications for his multiple health conditions.   ATTESTASTION STATEMENTS:  Reviewed by clinician on day of visit:  allergies, medications, problem list, medical history, surgical history, family history, social history, and previous encounter notes pertinent to obesity diagnosis.   I have personally spent 30 minutes total time today in preparation, patient care, nutritional counseling and documentation for this visit, including the following: review of clinical lab tests; review of medical tests/procedures/services.      [name], DO DABFM, DABOM [facility] [address] [place], [place] [address] [phone]

## 2023-11-06 NOTE — Assessment & Plan Note (Signed)
Last vitamin D Lab Results  Component Value Date   VD25OH 31.0 06/23/2023   He reports poor compliance taking RX vitamin D weekly He plans to improve his compliance in taking this and his Men's MVI daily  Repeat lab in 6 weeks

## 2023-11-06 NOTE — Assessment & Plan Note (Signed)
Last fasting insulin level improved from 32.8 to 19.6 with lifestyle changes + use of metformin XR 500 mg once daily He is tolerating metformin well without adverse SE but admits to poor compliance taking it the past 2-3 mos  Plan: Work on compliance taking metformin XR 500 mg once daily (adding to a pill box can help) Continue to work on a reduced kcal low sugar diet + increasing daily steps Plan to add in weight training by Spring Consider increasing metformin dose next visit

## 2023-11-06 NOTE — Patient Instructions (Addendum)
Try to keep protein shakes and fresh fruit at work (can have with an uncrustable for lunch)  Have a protein bar pre - workout like: [employer]  When you pick a premixed protein shake with 20-30 g of protein and < 8 g of sugar  Make it a habit to get a to go box when eating out

## 2023-11-06 NOTE — Assessment & Plan Note (Signed)
Lab Results  Component Value Date   CHOL 190 09/22/2023   HDL 33.10 (L) 09/22/2023   LDLCALC 75 06/23/2023   LDLDIRECT 81.0 09/22/2023   TRIG (H) 09/22/2023    508.0 Triglyceride is over 400; calculations on Lipids are invalid.   CHOLHDL 6 09/22/2023   Reviewed his labs drawn by PCP above He reports poor compliance taking his Rosuvastatin and Fenofibrate Now that his shift work is stable and his sleep schedule has improved, he is making an effort to take all of his medicine daily as directed  Continue to work on a low saturated fat diet and weight reduction Continue to work on improving compliance taking medications

## 2023-11-06 NOTE — Assessment & Plan Note (Signed)
Reviewed patient's overall progress with a 13% TBW loss in the past 1.5 years of medically supervisied weight management He has metabolic sequelae of dyslipidemia and insulin resistance His motivation to continue to work on healthy lifestyle changes remains  Plan: Improve compliance with dietary logging + gym workouts 3 days/ wk Consider AOM next visit if not seeing further results

## 2023-12-12 ENCOUNTER — Encounter: Payer: Self-pay | Admitting: Pulmonary Disease

## 2023-12-16 ENCOUNTER — Encounter: Payer: Self-pay | Admitting: Family Medicine

## 2023-12-16 ENCOUNTER — Ambulatory Visit: Payer: 59 | Admitting: Family Medicine

## 2023-12-16 VITALS — BP 124/86 | HR 80 | Temp 98.0°F | Ht 71.0 in | Wt 256.0 lb

## 2023-12-16 DIAGNOSIS — E66812 Obesity, class 2: Secondary | ICD-10-CM | POA: Diagnosis not present

## 2023-12-16 DIAGNOSIS — E781 Pure hyperglyceridemia: Secondary | ICD-10-CM

## 2023-12-16 DIAGNOSIS — E559 Vitamin D deficiency, unspecified: Secondary | ICD-10-CM | POA: Diagnosis not present

## 2023-12-16 DIAGNOSIS — E88819 Insulin resistance, unspecified: Secondary | ICD-10-CM

## 2023-12-16 DIAGNOSIS — Z6835 Body mass index (BMI) 35.0-35.9, adult: Secondary | ICD-10-CM

## 2023-12-16 DIAGNOSIS — E6609 Other obesity due to excess calories: Secondary | ICD-10-CM

## 2023-12-16 MED ORDER — VITAMIN D (ERGOCALCIFEROL) 1.25 MG (50000 UNIT) PO CAPS: 50000.0000 [IU] | ORAL_CAPSULE | ORAL | 0 refills | Status: DC

## 2023-12-16 NOTE — Progress Notes (Signed)
 Office: [phone]  /  Fax: [phone]  WEIGHT SUMMARY AND BIOMETRICS  Starting Date: 01/30/22  Starting Weight: 291lb   Weight Lost Since Last Visit: 0lb   Vitals Temp: 98 F (36.7 C) BP: 124/86 Pulse Rate: 80 SpO2: 97 %   Body Composition  Body Fat %: 30.2 % Fat Mass (lbs): 77.4 lbs Muscle Mass (lbs): 170 lbs Total Body Water (lbs): 124 lbs Visceral Fat Rating : 14    HPI  Chief Complaint: OBESITY  Jonathan Hutchinson is here to discuss his progress with his obesity treatment plan. He is on the the Category 4 Plan and states he is following his eating plan approximately 30 % of the time. He states he is exercising 0 minutes 0 times per week.  Interval History:  Since last office visit he is up 3 lb He has been eating dinner and a snack after dinner He eats dinner out 2 x a week He tends to snack more on days off work (boredom snacking) He has a veggie with dinner with one starch He has good support system He plans to get back to the gym at work and added some outdoor walking He is working third shift and is no longer training He has a net weight loss of 35 pounds in the past 23 months of medically supervised weight management That is a 12% total body weight loss without use of antiobesity medications   Pharmacotherapy: Metformin XR 500 mg once daily--reports poor compliance taking  PHYSICAL EXAM:  Blood pressure 124/86, pulse 80, temperature 98 F (36.7 C), height 5\' 11"  (1.803 m), weight 256 lb (116.1 kg), SpO2 97%. Body mass index is 35.7 kg/m.  General: He is overweight, cooperative, alert, well developed, and in no acute distress. PSYCH: Has normal mood, affect and thought process.   Lungs: Normal breathing effort, no conversational dyspnea.   ASSESSMENT AND PLAN  TREATMENT PLAN FOR OBESITY:  Recommended Dietary Goals  Jonathan Hutchinson is currently in the action stage of change. As such, his goal is to continue weight management plan. He has agreed to keeping  a food journal and adhering to recommended goals of 2200 calories and 130 g of protein and practicing portion control and making smarter food choices, such as increasing vegetables and decreasing simple carbohydrates.  Behavioral Intervention  We discussed the following Behavioral Modification Strategies today: increasing lean protein intake to established goals, increasing fiber rich foods, increasing water intake , work on meal planning and preparation, work on tracking and journaling calories using tracking application, keeping healthy foods at home, decreasing eating out or consumption of processed foods, and making healthy choices when eating convenient foods, avoiding temptations and identifying enticing environmental cues, planning for success, and continue to work on maintaining a reduced calorie state, getting the recommended amount of protein, incorporating whole foods, making healthy choices, staying well hydrated and practicing mindfulness when eating..  Additional resources provided today: NA  Recommended Physical Activity Goals  Jonathan Hutchinson has been advised to work up to 150 minutes of moderate intensity aerobic activity a week and strengthening exercises 2-3 times per week for cardiovascular health, weight loss maintenance and preservation of muscle mass.   He has agreed to [employer] strengthening exercises with a goal of 2-3 sessions a week  and Start aerobic activity with a goal of 150 minutes a week at moderate intensity.  Resume gym workouts at work 2 to 3 days a week Add in 30 to 45-minute walks on days off work  Pharmacotherapy changes for  the treatment of obesity: None, discussed potentially using Qsymia in the future  ASSOCIATED CONDITIONS ADDRESSED TODAY  Insulin resistance Last fasting insulin elevated at 19.6 on metformin XR 500 mg once daily with compliance.  He has room for improvement with more consistent exercise and dietary choices.  He is actively working on weight  reduction, losing 12% of his total body weight and was 2 years of medically supervised weight management  Continue to work on healthy dietary changes, more consistent exercise and increase working on improving his compliance taking his metformin XR 500 mg once daily with food.  Plan to recheck fasting insulin in the next 1 to 2 months  Vitamin D deficiency Last vitamin D Lab Results  Component Value Date   VD25OH 31.0 06/23/2023  He has been taking vitamin D 50,000 IU once weekly.  His energy level is fair.  Plan to recheck vitamin D level in 1 to 2 months  -     Vitamin D (Ergocalciferol); Take 1 capsule (50,000 Units total) by mouth every 7 (seven) days.  Dispense: 12 capsule; Refill: 0  Hypertriglyceridemia Lab Results  Component Value Date   CHOL 190 09/22/2023   HDL 33.10 (L) 09/22/2023   LDLCALC 75 06/23/2023   LDLDIRECT 81.0 09/22/2023   TRIG (H) 09/22/2023    508.0 Triglyceride is over 400; calculations on Lipids are invalid.   CHOLHDL 6 09/22/2023    He reports good compliance taking fenofibrate 145 mg once daily in addition to rosuvastatin 10 mg once daily.  He denies adverse side effects.  He is working on reducing his intake of saturated fat and sugar.  He has room for improvement with compliance.  He is scheduled for repeat labs with his PCP to be drawn at his next visit  Class 2 obesity due to excess calories with body mass index (BMI) of 35.0 to 35.9 in adult, unspecified whether serious comorbidity present Given his plateau in weight loss over the past several months, we discussed ways to improve compliance with dietary choices, regular exercise, level of motivation and controlling appetite.  He lacks insurance coverage for GLP-1 receptor agonist.  Consider use of Qsymia in the future.  Brochure given    He was informed of the importance of frequent follow up visits to maximize his success with intensive lifestyle modifications for his multiple health  conditions.   ATTESTASTION STATEMENTS:  Reviewed by clinician on day of visit: allergies, medications, problem list, medical history, surgical history, family history, social history, and previous encounter notes pertinent to obesity diagnosis.   I have personally spent 30 minutes total time today in preparation, patient care, nutritional counseling and documentation for this visit, including the following: review of clinical lab tests; review of medical tests/procedures/services.      [name], DO DABFM, DABOM [facility] [address] [place], [place] [address] [phone]

## 2024-01-28 ENCOUNTER — Ambulatory Visit: Admitting: Family Medicine

## 2024-01-28 ENCOUNTER — Encounter: Payer: Self-pay | Admitting: Family Medicine

## 2024-01-28 VITALS — BP 132/88 | HR 83 | Temp 98.2°F | Ht 71.0 in | Wt 253.0 lb

## 2024-01-28 DIAGNOSIS — E88819 Insulin resistance, unspecified: Secondary | ICD-10-CM | POA: Diagnosis not present

## 2024-01-28 DIAGNOSIS — Z6835 Body mass index (BMI) 35.0-35.9, adult: Secondary | ICD-10-CM

## 2024-01-28 DIAGNOSIS — E66812 Obesity, class 2: Secondary | ICD-10-CM

## 2024-01-28 DIAGNOSIS — E781 Pure hyperglyceridemia: Secondary | ICD-10-CM | POA: Diagnosis not present

## 2024-01-28 DIAGNOSIS — R7401 Elevation of levels of liver transaminase levels: Secondary | ICD-10-CM | POA: Diagnosis not present

## 2024-01-28 DIAGNOSIS — E559 Vitamin D deficiency, unspecified: Secondary | ICD-10-CM | POA: Diagnosis not present

## 2024-01-28 DIAGNOSIS — E6609 Other obesity due to excess calories: Secondary | ICD-10-CM

## 2024-01-28 NOTE — Patient Instructions (Signed)
 Aim for gym workouts after work 4 days/ wk Pack gym bag the night before  Aim for 8 hrs of sleep at night  Keep lunch calories < 600

## 2024-01-28 NOTE — Progress Notes (Signed)
 Office: [phone]  /  Fax: [phone]  WEIGHT SUMMARY AND BIOMETRICS  Starting Date: 01/30/22  Starting Weight: 291lb   Weight Lost Since Last Visit: 3lb   Vitals Temp: 98.2 F (36.8 C) BP: 132/88 Pulse Rate: 83 SpO2: 97 %   Body Composition  Body Fat %: 29.4 % Fat Mass (lbs): 74.6 lbs Muscle Mass (lbs): 170.4 lbs Total Body Water (lbs): 125.2 lbs Visceral Fat Rating : 14    HPI  Chief Complaint: OBESITY  Jonathan Hutchinson is here to discuss his progress with his obesity treatment plan. He is on the the Category 4 Plan and states he is following his eating plan approximately 80 % of the time. He states he is exercising 0 minutes 0 times per week.  Interval History:  Since last office visit he is down 3 lb This gives him a net weight loss of 38 lb in 2 years of medically supervised weight management Support at home is lacking This is a 13% TBW loss  He did travel to family for Easter He is working day shift  24 hr food intake Breakfast: protein shake + apple Snack: protein bar Lunches: healthy meals out Dinner: balanced meals (lean protein, veggie + starches) -- at home 4-5 days/ wk Usually nothing after dinner  Drinks: water, sugar free drinks  He plans to add in gym workouts at work gym after work  Pharmacotherapy: none  PHYSICAL EXAM:  Blood pressure 132/88, pulse 83, temperature 98.2 F (36.8 C), height 5\' 11"  (1.803 m), weight 253 lb (114.8 kg), SpO2 97%. Body mass index is 35.29 kg/m.  General: He is overweight, cooperative, alert, well developed, and in no acute distress. PSYCH: Has normal mood, affect and thought process.   Lungs: Normal breathing effort, no conversational dyspnea.   ASSESSMENT AND PLAN  TREATMENT PLAN FOR OBESITY:  Recommended Dietary Goals  Jonathan Hutchinson is currently in the action stage of change. As such, his goal is to continue weight management plan. He has agreed to practicing portion control and making smarter food  choices, such as increasing vegetables and decreasing simple carbohydrates.  Behavioral Intervention  We discussed the following Behavioral Modification Strategies today: increasing lean protein intake to established goals, increasing fiber rich foods, increasing water intake , work on meal planning and preparation, work on Counselling psychologist calories using tracking application, keeping healthy foods at home, continue to work on implementation of reduced calorie nutritional plan, and continue to work on maintaining a reduced calorie state, getting the recommended amount of protein, incorporating whole foods, making healthy choices, staying well hydrated and practicing mindfulness when eating..  Additional resources provided today: NA  Recommended Physical Activity Goals  Jonathan Hutchinson has been advised to work up to 150 minutes of moderate intensity aerobic activity a week and strengthening exercises 2-3 times per week for cardiovascular health, weight loss maintenance and preservation of muscle mass.   He has agreed to [employer] strengthening exercises with a goal of 2-3 sessions a week  and Start aerobic activity with a goal of 150 minutes a week at moderate intensity.   Pharmacotherapy changes for the treatment of obesity: none  ASSOCIATED CONDITIONS ADDRESSED TODAY  Insulin  resistance Fasting insulin  high Working on reducing added sugar (fair control) and exercise (room for improvement) He is doing well on metformin  Xr 500 mg daily with food -     Basic metabolic panel with GFR -     Insulin , random  Vitamin D  deficiency Last vitamin D  Lab Results  Component  Value Date   VD25OH 31.0 06/23/2023  Doing well on vitamin D  RX weekly Energy level improving  -     VITAMIN D  25 Hydroxy (Vit-D Deficiency, Fractures)  Hypertriglyceridemia Will forward results to PCP Doing well on fenofibrate  145 mg daily and Crestor  10 mg daily Denies adverse SE Working on prescribed diet -     Lipid  panel  Transaminitis -     Hepatic function panel  Class 2 obesity due to excess calories with body mass index (BMI) of 35.0 to 35.9 in adult, unspecified whether serious comorbidity present      He was informed of the importance of frequent follow up visits to maximize his success with intensive lifestyle modifications for his multiple health conditions.   ATTESTASTION STATEMENTS:  Reviewed by clinician on day of visit: allergies, medications, problem list, medical history, surgical history, family history, social history, and previous encounter notes pertinent to obesity diagnosis.   I have personally spent 30 minutes total time today in preparation, patient care, nutritional counseling and education,  and documentation for this visit, including the following: review of most recent clinical lab tests, prescribing medications/ refilling medications, reviewing medical assistant documentation, review and interpretation of bioimpedence results.     Micky Albee, D.O. DABFM, DABOM [facility] [address] [place], [place] [address] [phone]

## 2024-01-29 LAB — HEPATIC FUNCTION PANEL
ALT: 26 IU/L (ref 0–44)
AST: 23 IU/L (ref 0–40)
Albumin: 4.6 g/dL (ref 4.1–5.1)
Alkaline Phosphatase: 55 IU/L (ref 44–121)
Bilirubin Total: 0.8 mg/dL (ref 0.0–1.2)
Bilirubin, Direct: 0.25 mg/dL (ref 0.00–0.40)
Total Protein: 6.6 g/dL (ref 6.0–8.5)

## 2024-01-29 LAB — BASIC METABOLIC PANEL WITH GFR
BUN/Creatinine Ratio: 12 (ref 9–20)
BUN: 10 mg/dL (ref 6–20)
CO2: 22 mmol/L (ref 20–29)
Calcium: 9.2 mg/dL (ref 8.7–10.2)
Chloride: 105 mmol/L (ref 96–106)
Creatinine, Ser: 0.82 mg/dL (ref 0.76–1.27)
Glucose: 85 mg/dL (ref 70–99)
Potassium: 4 mmol/L (ref 3.5–5.2)
Sodium: 143 mmol/L (ref 134–144)
eGFR: 115 mL/min/{1.73_m2} (ref >59)

## 2024-01-29 LAB — LIPID PANEL
Chol/HDL Ratio: 4.9 ratio (ref 0.0–5.0)
Cholesterol, Total: 166 mg/dL (ref 100–199)
HDL: 34 mg/dL — ABNORMAL LOW (ref >39)
LDL Chol Calc (NIH): 93 mg/dL (ref 0–99)
Triglycerides: 230 mg/dL — ABNORMAL HIGH (ref 0–149)
VLDL Cholesterol Cal: 39 mg/dL (ref 5–40)

## 2024-01-29 LAB — VITAMIN D 25 HYDROXY (VIT D DEFICIENCY, FRACTURES): Vit D, 25-Hydroxy: 32 ng/mL (ref 30.0–100.0)

## 2024-01-29 LAB — INSULIN, RANDOM: INSULIN: 33.3 u[IU]/mL — ABNORMAL HIGH (ref 2.6–24.9)

## 2024-03-15 ENCOUNTER — Ambulatory Visit: Admitting: Family Medicine

## 2024-03-15 ENCOUNTER — Encounter: Payer: Self-pay | Admitting: Family Medicine

## 2024-03-15 VITALS — BP 139/94 | HR 102 | Temp 98.0°F | Ht 71.0 in | Wt 255.0 lb

## 2024-03-15 DIAGNOSIS — E559 Vitamin D deficiency, unspecified: Secondary | ICD-10-CM | POA: Diagnosis not present

## 2024-03-15 DIAGNOSIS — E88819 Insulin resistance, unspecified: Secondary | ICD-10-CM | POA: Diagnosis not present

## 2024-03-15 DIAGNOSIS — R030 Elevated blood-pressure reading, without diagnosis of hypertension: Secondary | ICD-10-CM

## 2024-03-15 DIAGNOSIS — E66812 Obesity, class 2: Secondary | ICD-10-CM

## 2024-03-15 DIAGNOSIS — E781 Pure hyperglyceridemia: Secondary | ICD-10-CM | POA: Diagnosis not present

## 2024-03-15 DIAGNOSIS — Z6835 Body mass index (BMI) 35.0-35.9, adult: Secondary | ICD-10-CM

## 2024-03-15 MED ORDER — FENOFIBRATE 145 MG PO TABS
145.0000 mg | ORAL_TABLET | Freq: Every day | ORAL | 1 refills | Status: DC
Start: 2024-03-15 — End: 2024-06-08

## 2024-03-15 MED ORDER — METFORMIN HCL ER 750 MG PO TB24: 750.0000 mg | ORAL_TABLET | Freq: Every day | ORAL | 1 refills | Status: DC

## 2024-03-15 NOTE — Patient Instructions (Addendum)
 Increase to  Metformin  XR 750 mg daily with food  Change RX vitamin D  weekly to daily vitamin D3 + K2 5,000 international units  daily  Gym exercise 3-4 days/ wk (cardio and weight training)  MyFitnessPal:  2200 cal/ day This should include 120-140 g of protein daily  Limit meals out/ fried foods/ sweets  Hydrate well water and sugar free drinks

## 2024-03-15 NOTE — Progress Notes (Signed)
 Office: [phone]  /  Fax: [phone]  WEIGHT SUMMARY AND BIOMETRICS  Starting Date: 01/30/22  Starting Weight: 291lb   Weight Lost Since Last Visit: 0lb   Vitals Temp: 98 F (36.7 C) BP: (!) 139/94 Pulse Rate: (!) 102 SpO2: 96 %   Body Composition  Body Fat %: 29.6 % Fat Mass (lbs): 75.6 lbs Muscle Mass (lbs): 170.8 lbs Total Body Water (lbs): 124.2 lbs Visceral Fat Rating : 14    HPI  Chief Complaint: OBESITY  Jonathan Hutchinson is here to discuss his progress with his obesity treatment plan. He is on the the Category 4 Plan and states he is following his eating plan approximately 30 % of the time. He states he is exercising 0 minutes 0 times per week.  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 Body mass index is 35.57 kg/m.  General: He is overweight, cooperative, alert, well developed, and in no acute distress. PSYCH: Has normal mood, affect and thought process.   Lungs: Normal breathing effort, no conversational dyspnea.  ASSESSMENT AND PLAN  TREATMENT PLAN FOR OBESITY:  Recommended Dietary Goals  Niklaus is currently in the action stage of change. As such, his goal is to continue weight management plan. He has agreed to keeping a food journal and adhering to recommended goals of 2200 calories and 120-140 g of  protein. Start logging again on the MyFitnessPal app  Behavioral  Intervention  We discussed the following Behavioral Modification Strategies today: increasing lean protein intake to established goals, increasing fiber rich foods, increasing water intake , work on meal planning and preparation, work on tracking and journaling calories using tracking application, keeping healthy foods at home, identifying sources and decreasing liquid calories, work on managing stress, creating time for self-care and relaxation, avoiding temptations and identifying enticing environmental cues, continue to work on implementation of reduced calorie nutritional plan, continue to practice mindfulness when eating, planning for success, and continue to work on maintaining a reduced calorie state, getting the recommended amount of protein, incorporating whole foods, making healthy choices, staying well hydrated and practicing mindfulness when eating..  Additional resources provided today: NA  Recommended Physical Activity Goals  Jonathan Hutchinson has been advised to work up to 150 minutes of moderate intensity aerobic activity a week and strengthening exercises 2-3 times per week for cardiovascular health, weight loss maintenance and preservation of muscle mass.   He has agreed to [employer] strengthening exercises with a goal of 2-3 sessions a week  and Start aerobic activity with a goal of 150 minutes a week at moderate intensity.  Gym goal set for 3-4 days/ wk cardio + weight training  Pharmacotherapy changes for the treatment of obesity: increase metformin  XR to 750 mg daily  ASSOCIATED CONDITIONS ADDRESSED TODAY  Insulin  resistance Fasting insulin  still high, labs from April reviewed with patient today He  has tolerated metformin  XR 500 mg daily without problem He is off track with both diet and exercise Increase metformin  dose to:   -     metFORMIN  HCl ER; Take 1 tablet (750 mg total) by mouth daily with breakfast.  Dispense: 30 tablet; Refill: 1  Hypertriglyceridemia Lab Results  Component  Value Date   CHOL 166 01/28/2024   HDL 34 (L) 01/28/2024   LDLCALC 93 01/28/2024   LDLDIRECT 81.0 09/22/2023   TRIG 230 (H) 01/28/2024   CHOLHDL 4.9 01/28/2024   Reviewed labs with patient.  Tgs improving on Tricor  145 mg daily, tolerating well Admits to consuming more fried foods lately and has room to ramp up exercise  -     Fenofibrate ; Take 1 tablet (145 mg total) by mouth daily.  Dispense: 31 tablet; Refill: 1  Elevated blood pressure reading BP elevated today without hx of HTN Stress levels high and diet has been poor Work on reducing high sodium foods, resuming regular exercise and prioritizing stress reduction and sleep Repeat next visit/ follow with PCP  Class 2 severe obesity due to excess calories with serious comorbidity and body mass index (BMI) of 35.0 to 35.9 in adult [facility])  Vitamin D  deficiency This SmartLink has not been configured with any valid records.   Last vitamin D  Lab Results  Component Value Date   VD25OH 32.0 01/28/2024   Reviewed lab with patient Vitamin D  remains low on RX vitamin D  weekly Change to OTC vitamin D3+ K2 5,000 international units  daily     He was informed of the importance of frequent follow up visits to maximize his success with intensive lifestyle modifications for his multiple health conditions.   ATTESTASTION STATEMENTS:  Reviewed by clinician on day of visit: allergies, medications, problem list, medical history, surgical history, family history, social history, and previous encounter notes pertinent to obesity diagnosis.   I have personally spent 30 minutes total time today in preparation, patient care, nutritional counseling and education,  and documentation for this visit, including the following: review of most recent clinical lab tests, prescribing medications/ refilling medications, reviewing medical assistant documentation, review and interpretation of bioimpedence results.     Micky Albee, D.O. DABFM, DABOM [facility] [address] [place], [place] [address] [phone]

## 2024-05-11 ENCOUNTER — Ambulatory Visit: Admitting: Family Medicine

## 2024-06-08 ENCOUNTER — Ambulatory Visit: Admitting: Family Medicine

## 2024-06-08 ENCOUNTER — Encounter: Payer: Self-pay | Admitting: Family Medicine

## 2024-06-08 VITALS — BP 127/82 | HR 81 | Temp 97.7°F | Ht 71.0 in | Wt 257.0 lb

## 2024-06-08 DIAGNOSIS — E88819 Insulin resistance, unspecified: Secondary | ICD-10-CM | POA: Diagnosis not present

## 2024-06-08 DIAGNOSIS — Z6835 Body mass index (BMI) 35.0-35.9, adult: Secondary | ICD-10-CM

## 2024-06-08 DIAGNOSIS — E66812 Obesity, class 2: Secondary | ICD-10-CM | POA: Diagnosis not present

## 2024-06-08 DIAGNOSIS — E781 Pure hyperglyceridemia: Secondary | ICD-10-CM

## 2024-06-08 DIAGNOSIS — E559 Vitamin D deficiency, unspecified: Secondary | ICD-10-CM

## 2024-06-08 MED ORDER — FENOFIBRATE 145 MG PO TABS: 145.0000 mg | ORAL_TABLET | Freq: Every day | ORAL | 1 refills | Status: AC

## 2024-06-08 MED ORDER — METFORMIN HCL ER 750 MG PO TB24: 750.0000 mg | ORAL_TABLET | Freq: Every day | ORAL | 1 refills | Status: DC

## 2024-06-08 NOTE — Progress Notes (Signed)
 Office: [phone]  /  Fax: [phone]  WEIGHT SUMMARY AND BIOMETRICS  Starting Date: 01/30/22  Starting Weight: 291lb   Weight Lost Since Last Visit: 0   Vitals Temp: 97.7 F (36.5 C) BP: 127/82 Pulse Rate: 81 SpO2: 97 %   Body Composition  Body Fat %: 30.9 % Fat Mass (lbs): 79.6 lbs Muscle Mass (lbs): 169.4 lbs Total Body Water (lbs): 128.2 lbs Visceral Fat Rating : 15    HPI  Chief Complaint: OBESITY  Jonathan Hutchinson is here to discuss his progress with his obesity treatment plan. He is on the the Category 4 Plan and states he is following his eating plan approximately 60 % of the time. He states he is exercising 0 minutes 0 times per week.  Interval History:  Since last office visit he is up 2 lb He is down 1.4 lb of muscle mass and up 4 lb of body fat since last visit He was last seen 2.5 mos ago This gives him a net weight loss of 34 lb in 2+ years of medically supervised weight management This is an 11.6% TBW loss He did come off all of his meds this Summer He is working day shift but is still lacking sleep He has been working more days (as Emergency planning/management officer) He denies large portion sizes, over snacking or craving sugar He hasn't been as active outside of work He never tried metformin  XR 750 mg daily He plans to use the gym at work and do the treadmill and weight - 3-4 days/ wk  Pharmacotherapy: none (not taking metformin  RX)  PHYSICAL EXAM:  Blood pressure 127/82, pulse 81, temperature 97.7 F (36.5 C), height 5' 11 (1.803 m), weight 257 lb (116.6 kg), SpO2 97%. Body mass index is 35.84 kg/m.  General: He is overweight, cooperative, alert, well developed, and in no acute distress. PSYCH: Has normal mood, affect and thought process.   Lungs: Normal breathing effort, no conversational dyspnea.  ASSESSMENT AND PLAN  TREATMENT PLAN FOR OBESITY:  Recommended Dietary Goals  Jabreel is currently in the action stage of change. As such, his goal is to  continue weight management plan. He has agreed to keeping a food journal and adhering to recommended goals of 2000 calories and 130 g of  protein and practicing portion control and making smarter food choices, such as increasing vegetables and decreasing simple carbohydrates.  Behavioral Intervention  We discussed the following Behavioral Modification Strategies today: increasing lean protein intake to established goals, increasing fiber rich foods, increasing water intake , work on meal planning and preparation, work on tracking and journaling calories using tracking application, keeping healthy foods at home, identifying sources and decreasing liquid calories, decreasing eating out or consumption of processed foods, and making healthy choices when eating convenient foods, practice mindfulness eating and understand the difference between hunger signals and cravings, avoiding temptations and identifying enticing environmental cues, continue to work on maintaining a reduced calorie state, getting the recommended amount of protein, incorporating whole foods, making healthy choices, staying well hydrated and practicing mindfulness when eating., and increase protein intake, fibrous foods (25 grams per day for women, 30 grams for men) and water to improve satiety and decrease hunger signals. .  Additional resources provided today: NA  Recommended Physical Activity Goals  Nazareth has been advised to work up to 150 minutes of moderate intensity aerobic activity a week and strengthening exercises 2-3 times per week for cardiovascular health, weight loss maintenance and preservation of muscle mass.  He has agreed to Think about enjoyable ways to increase daily physical activity and overcoming barriers to exercise, Start strengthening exercises with a goal of 2-3 sessions a week , and Start aerobic activity with a goal of 150 minutes a week at moderate intensity.   Pharmacotherapy changes for the treatment of  obesity: resume Metformin  XR 750 mg daily  ASSOCIATED CONDITIONS ADDRESSED TODAY  Insulin  resistance Overall, he is down >10% TBW loss in 2 years of medically supervised weight management but has been inconsistent with diet and exercise largely due to work stress/ work hours as a Emergency planning/management officer.  We discussed the importance of sleep, nutrition, weight reduction and regular exercise for the treatment of IR.  In addition, will resume Metformin  XR at 750 mg dose.  -     metFORMIN  HCl ER; Take 1 tablet (750 mg total) by mouth daily with breakfast.  Dispense: 30 tablet; Refill: 1  Hypertriglyceridemia Lab Results  Component Value Date   CHOL 166 01/28/2024   HDL 34 (L) 01/28/2024   LDLCALC 93 01/28/2024   LDLDIRECT 81.0 09/22/2023   TRIG 230 (H) 01/28/2024   CHOLHDL 4.9 01/28/2024   Asked him to resume his Crestor  10 mg daily as well prescribed by his PCP -     Fenofibrate ; Take 1 tablet (145 mg total) by mouth daily.  Dispense: 31 tablet; Refill: 1  Class 2 obesity due to excess calories with body mass index (BMI) of 35.0 to 35.9 in adult, unspecified whether serious comorbidity present We discussed potentially adding in Zepbound for OSA and obesity management Will check insurance coverage Resume dietary logging  Vitamin D  deficiency Last vitamin D  Lab Results  Component Value Date   VD25OH 32.0 01/28/2024   He has not been taking his OTC vitamin D  daily and fatigue is worse Resume OTC vitamin D  4,000 international units  daily and repeat lab in 1-2 mos     He was informed of the importance of frequent follow up visits to maximize his success with intensive lifestyle modifications for his multiple health conditions.   ATTESTASTION STATEMENTS:  Reviewed by clinician on day of visit: allergies, medications, problem list, medical history, surgical history, family history, social history, and previous encounter notes pertinent to obesity diagnosis.     Darice Haddock, D.O. DABFM,  DABOM [facility] [address] [place], [place] [address] [phone]

## 2024-06-08 NOTE — Patient Instructions (Signed)
 Track calories on MyFitnessPal Aim for 2000 cal/ day This should include 100+ g of protein per day  Work on eating on a schedule Get in fruits and veggies daily Stay of sugar / high calorie drinks  Restart metformin  XR 750 mg  Resume vitamin D3 4,000 international units  daily  Aim for gym workouts (cardio/ weight training) 3-4 x a week

## 2024-06-10 ENCOUNTER — Telehealth: Payer: Self-pay | Admitting: *Deleted

## 2024-06-10 NOTE — Telephone Encounter (Signed)
 Prior authorization done via cover my meds for patients Zepbound. Waiting on determination.

## 2024-06-10 NOTE — Telephone Encounter (Signed)
 PA for Zepbound 2.5 has been denied. PA is now complete.     Message from Plan Request Reference Number: [id number]. ZEPBOUND INJ 2.5/0.5 is approved through 06/10/2025. Your patient may now fill this prescription and it will be covered.. Authorization Expiration Date: June 10, 2025.

## 2024-07-20 ENCOUNTER — Ambulatory Visit: Admitting: Family Medicine

## 2024-08-04 ENCOUNTER — Telehealth: Payer: Self-pay | Admitting: *Deleted

## 2024-08-04 ENCOUNTER — Encounter: Payer: Self-pay | Admitting: Family Medicine

## 2024-08-04 ENCOUNTER — Ambulatory Visit: Admitting: Family Medicine

## 2024-08-04 VITALS — BP 147/82 | HR 97 | Ht 71.0 in | Wt 259.0 lb

## 2024-08-04 DIAGNOSIS — E66812 Obesity, class 2: Secondary | ICD-10-CM

## 2024-08-04 DIAGNOSIS — E8881 Metabolic syndrome: Secondary | ICD-10-CM

## 2024-08-04 DIAGNOSIS — R635 Abnormal weight gain: Secondary | ICD-10-CM

## 2024-08-04 DIAGNOSIS — E88819 Insulin resistance, unspecified: Secondary | ICD-10-CM

## 2024-08-04 DIAGNOSIS — E782 Mixed hyperlipidemia: Secondary | ICD-10-CM

## 2024-08-04 DIAGNOSIS — R030 Elevated blood-pressure reading, without diagnosis of hypertension: Secondary | ICD-10-CM

## 2024-08-04 DIAGNOSIS — E559 Vitamin D deficiency, unspecified: Secondary | ICD-10-CM | POA: Diagnosis not present

## 2024-08-04 DIAGNOSIS — Z6836 Body mass index (BMI) 36.0-36.9, adult: Secondary | ICD-10-CM

## 2024-08-04 DIAGNOSIS — R5383 Other fatigue: Secondary | ICD-10-CM

## 2024-08-04 MED ORDER — TIRZEPATIDE 2.5 MG/0.5ML ~~LOC~~ SOAJ: 2.5000 mg | SUBCUTANEOUS | 0 refills | Status: DC

## 2024-08-04 NOTE — Progress Notes (Signed)
 Office: [phone]  /  Fax: [phone]  WEIGHT SUMMARY AND BIOMETRICS  Starting Date: 01/30/22  Starting Weight: 291lb   Weight Lost Since Last Visit: 0lb   Vitals BP: (!) 147/82 Pulse Rate: 97 SpO2: 99 %   Body Composition  Body Fat %: 31 % Fat Mass (lbs): 80.6 lbs Muscle Mass (lbs): 170.4 lbs Total Body Water (lbs): 127.2 lbs Visceral Fat Rating : 15   HPI  Chief Complaint: OBESITY  Jonathan Hutchinson is here to discuss his progress with his obesity treatment plan. He is on the the Category 4 Plan and states he is following his eating plan approximately 50 % of the time. He states he is exercising 0 minutes 0 times per week.  Interval History:  Since last office visit he is up 2 pounds  This gives him a net weight loss of 32 pounds and 2+ years of medically supervised weight management This is a 10.9% total body weight loss He reports never starting his prescription for metformin  XR He has been under a lot of stress at work, failing to add in regular exercise Reports poor food choices, lack of meal planning and frequent meals out His motivation to make behavior changes has been lacking  Pharmacotherapy: None Prior authorization for Zepbound had been denied after last visit  PHYSICAL EXAM:  Blood pressure (!) 147/82, pulse 97, height 5' 11 (1.803 m), weight 259 lb (117.5 kg), SpO2 99%. Body mass index is 36.12 kg/m.  General: He is overweight, cooperative, alert, well developed, and in no acute distress. PSYCH: Has normal mood, affect and thought process.   Lungs: Normal breathing effort, no conversational dyspnea.   ASSESSMENT AND PLAN  TREATMENT PLAN FOR OBESITY:  Recommended Dietary Goals  Jonathan Hutchinson is currently in the action stage of change. As such, his goal is to continue weight management plan. He has agreed to keeping a food journal and adhering to recommended goals of 2000 calories and 110 g of protein and practicing portion control and making smarter  food choices, such as increasing vegetables and decreasing simple carbohydrates.  Behavioral Intervention  We discussed the following Behavioral Modification Strategies today: increasing lean protein intake to established goals, increasing fiber rich foods, increasing water intake , work on meal planning and preparation, work on counselling psychologist calories using tracking application, keeping healthy foods at home, work on managing stress, creating time for self-care and relaxation, and avoiding temptations and identifying enticing environmental cues.  Additional resources provided today: NA  Recommended Physical Activity Goals  Jonathan Hutchinson has been advised to work up to 150 minutes of moderate intensity aerobic activity a week and strengthening exercises 2-3 times per week for cardiovascular health, weight loss maintenance and preservation of muscle mass.   He has agreed to [employer] strengthening exercises with a goal of 2-3 sessions a week   Pharmacotherapy changes for the treatment of obesity: Run new prior authorization for Mounjaro given his history of insulin  resistance and metabolic syndrome  ASSOCIATED CONDITIONS ADDRESSED TODAY  Insulin  resistance Patient failed to start prescription for metformin  to address insulin  resistance and metabolic syndrome.  He often forgets to take oral medication as he works as a emergency planning/management officer.  His last fasting insulin  was high at 33.3 obtained 01/28/2024.  Continue work on reducing added sugar intake.  Begin Mounjaro, off label without type 2 diabetes.  He would greatly benefit from this medication. Recheck fasting insulin  today -     Tirzepatide; Inject 2.5 mg into the skin once a  week.  Dispense: 2 mL; Refill: 0 -     Insulin , random  Metabolic syndrome Lab Results  Component Value Date   CHOL 166 01/28/2024   HDL 34 (L) 01/28/2024   LDLCALC 93 01/28/2024   LDLDIRECT 81.0 09/22/2023   TRIG 230 (H) 01/28/2024   CHOLHDL 4.9 01/28/2024  Look for  improvements in triglyceride and HDL levels with weight reduction, use of a GLP-1 receptor/GIP receptor agonist Continue to ramp up regular exercise frequency and reduce added sugar intake -     Tirzepatide; Inject 2.5 mg into the skin once a week.  Dispense: 2 mL; Refill: 0  Vitamin D  deficiency Last vitamin D  Lab Results  Component Value Date   VD25OH 32.0 01/28/2024  He has not been taking a vitamin D  supplement.  He does complain of fatigue.  Repeat vitamin D  level today with a target goal over 50  -     VITAMIN D  25 Hydroxy (Vit-D Deficiency, Fractures)  Weight gain -     Comprehensive metabolic panel with GFR -     TSH Rfx on Abnormal to Free T4 -     Hemoglobin A1c  Mixed hyperlipidemia -     Lipid panel  Other fatigue -     Comprehensive metabolic panel with GFR -     CBC  Obesity, Class II, BMI 35-39.9 Stable.  Reviewed his bioimpedance and overall goals over time.  He is currently lacking motivation with high stress levels at work.  Did decline referral to a therapist.  BMI 36.0-36.9,adult  Elevated blood pressure reading His blood pressure reading is elevated today.  Notably, he is not on any antihypertensive medications.  He has been consuming quite a bit of high sodium foods with frequent meals out.  Look for improvements with dietary change and weight loss.     He was informed of the importance of frequent follow up visits to maximize his success with intensive lifestyle modifications for his multiple health conditions.   ATTESTASTION STATEMENTS:  Reviewed by clinician on day of visit: allergies, medications, problem list, medical history, surgical history, family history, social history, and previous encounter notes pertinent to obesity diagnosis.   I have personally spent 31 minutes total time today in preparation, patient care, nutritional counseling and education,  and documentation for this visit, including the following: review of most recent clinical lab  tests, prescribing medications/ refilling medications, reviewing medical assistant documentation, review and interpretation of bioimpedence results.     Darice Haddock, D.O. DABFM, DABOM [facility] [address] [place], [place] [address] [phone]

## 2024-08-04 NOTE — Patient Instructions (Signed)
 Plan to begin Mounjaro 2.5 mg weekly A new PA will be run  Do the best you can to get 8 hrs of sleep at night Keep healthy foods at home to limit eating out

## 2024-08-04 NOTE — Telephone Encounter (Signed)
Prior authorization done via cover my meds for patients Mounjaro. Waiting on determination.  

## 2024-08-05 ENCOUNTER — Ambulatory Visit: Payer: Self-pay | Admitting: Family Medicine

## 2024-08-05 LAB — CBC
Hematocrit: 47.2 % (ref 37.5–51.0)
Hemoglobin: 16.2 g/dL (ref 13.0–17.7)
MCH: 29.6 pg (ref 26.6–33.0)
MCHC: 34.3 g/dL (ref 31.5–35.7)
MCV: 86 fL (ref 79–97)
Platelets: 252 x10E3/uL (ref 150–450)
RBC: 5.47 x10E6/uL (ref 4.14–5.80)
RDW: 13.4 % (ref 11.6–15.4)
WBC: 5.8 x10E3/uL (ref 3.4–10.8)

## 2024-08-05 LAB — LIPID PANEL
Chol/HDL Ratio: 5.5 ratio — ABNORMAL HIGH (ref 0.0–5.0)
Cholesterol, Total: 183 mg/dL (ref 100–199)
HDL: 33 mg/dL — ABNORMAL LOW (ref >39)
LDL Chol Calc (NIH): 96 mg/dL (ref 0–99)
Triglycerides: 321 mg/dL — ABNORMAL HIGH (ref 0–149)
VLDL Cholesterol Cal: 54 mg/dL — ABNORMAL HIGH (ref 5–40)

## 2024-08-05 LAB — COMPREHENSIVE METABOLIC PANEL WITH GFR
ALT: 36 IU/L (ref 0–44)
AST: 25 IU/L (ref 0–40)
Albumin: 4.9 g/dL (ref 4.1–5.1)
Alkaline Phosphatase: 67 IU/L (ref 47–123)
BUN/Creatinine Ratio: 10 (ref 9–20)
BUN: 10 mg/dL (ref 6–20)
Bilirubin Total: 0.8 mg/dL (ref 0.0–1.2)
CO2: 21 mmol/L (ref 20–29)
Calcium: 9.2 mg/dL (ref 8.7–10.2)
Chloride: 105 mmol/L (ref 96–106)
Creatinine, Ser: 0.97 mg/dL (ref 0.76–1.27)
Globulin, Total: 1.7 g/dL (ref 1.5–4.5)
Glucose: 93 mg/dL (ref 70–99)
Potassium: 3.8 mmol/L (ref 3.5–5.2)
Sodium: 143 mmol/L (ref 134–144)
Total Protein: 6.6 g/dL (ref 6.0–8.5)
eGFR: 102 mL/min/1.73 (ref >59)

## 2024-08-05 LAB — HEMOGLOBIN A1C
Est. average glucose Bld gHb Est-mCnc: 91 mg/dL
Hgb A1c MFr Bld: 4.8 % (ref 4.8–5.6)

## 2024-08-05 LAB — VITAMIN D 25 HYDROXY (VIT D DEFICIENCY, FRACTURES): Vit D, 25-Hydroxy: 17.1 ng/mL — ABNORMAL LOW (ref 30.0–100.0)

## 2024-08-05 LAB — TSH RFX ON ABNORMAL TO FREE T4: TSH: 1.25 u[IU]/mL (ref 0.450–4.500)

## 2024-08-05 LAB — INSULIN, RANDOM: INSULIN: 40.5 u[IU]/mL — ABNORMAL HIGH (ref 2.6–24.9)

## 2024-08-10 NOTE — Telephone Encounter (Signed)
 Mounjaro was denied. Needs a diagnosis for type 2 diabetes.

## 2024-09-06 ENCOUNTER — Encounter: Payer: Self-pay | Admitting: Family Medicine

## 2024-09-06 ENCOUNTER — Ambulatory Visit: Admitting: Family Medicine

## 2024-09-06 VITALS — BP 135/90 | HR 90 | Temp 98.6°F | Ht 71.0 in | Wt 259.0 lb

## 2024-09-06 DIAGNOSIS — E559 Vitamin D deficiency, unspecified: Secondary | ICD-10-CM

## 2024-09-06 DIAGNOSIS — Z6836 Body mass index (BMI) 36.0-36.9, adult: Secondary | ICD-10-CM

## 2024-09-06 DIAGNOSIS — E8881 Metabolic syndrome: Secondary | ICD-10-CM | POA: Diagnosis not present

## 2024-09-06 DIAGNOSIS — R748 Abnormal levels of other serum enzymes: Secondary | ICD-10-CM

## 2024-09-06 DIAGNOSIS — R030 Elevated blood-pressure reading, without diagnosis of hypertension: Secondary | ICD-10-CM

## 2024-09-06 MED ORDER — VITAMIN D (ERGOCALCIFEROL) 1.25 MG (50000 UNIT) PO CAPS: 50000.0000 [IU] | ORAL_CAPSULE | ORAL | 0 refills | Status: AC

## 2024-09-06 NOTE — Assessment & Plan Note (Signed)
 Fibrosis 4 Score = .63 (Low risk)        Interpretation for patients with NAFLD          <1.30       -  F0-F1 (Low risk)          1.30-2.67 -  Indeterminate           >2.67      -  F3-F4 (High risk)     Validated for ages 85-65

## 2024-09-06 NOTE — Progress Notes (Signed)
 Office: [phone]  /  Fax: [phone]  WEIGHT SUMMARY AND BIOMETRICS  Starting Date: 01/30/22  Starting Weight: 291lb   Weight Lost Since Last Visit: 0lb   Vitals Temp: 98.6 F (37 C) BP: (!) 135/90 Pulse Rate: 90 SpO2: 94 %   Body Composition  Body Fat %: 30.4 % Fat Mass (lbs): 78.8 lbs Muscle Mass (lbs): 171.6 lbs Total Body Water (lbs): 125 lbs Visceral Fat Rating : 15    HPI  Chief Complaint: OBESITY  Jonathan Hutchinson is here to discuss his progress with his obesity treatment plan. He is on the the Category 4 Plan and states he is following his eating plan approximately 50 % of the time. He states he is exercising 0 minutes 0 times per week.  Interval History:  Since last office visit he is down 0 lb He is up 1.2 pounds of muscle mass and down 1.8 pounds of body fat since last visit This gives him a net weight loss of 32 pounds and 2+ years of medically supervised weight management This is a 10.9% total body weight loss He has been lacking workouts due to lack of time management and motivation He has not been tracking calories He admits to poor food choices and lack of planning He has not had coverage for any GLP-1 RA's He admits to not taking his metformin  RX  Pharmacotherapy: None  PHYSICAL EXAM:  Blood pressure (!) 135/90, pulse 90, temperature 98.6 F (37 C), height 5' 11 (1.803 m), weight 259 lb (117.5 kg), SpO2 94%. Body mass index is 36.12 kg/m.  General: He is overweight, cooperative, alert, well developed, and in no acute distress. PSYCH: Has normal mood, affect and thought process.   Lungs: Normal breathing effort, no conversational dyspnea.   ASSESSMENT AND PLAN  TREATMENT PLAN FOR OBESITY:  Recommended Dietary Goals  Geramy is currently in the action stage of change. As such, his goal is to continue weight management plan. He has agreed to using AI to create a new 2000 calorie healthy meal plan  Behavioral Intervention  We discussed  the following Behavioral Modification Strategies today: increasing lean protein intake to established goals, increasing fiber rich foods, increasing water intake , work on meal planning and preparation, reading food labels , keeping healthy foods at home, identifying sources and decreasing liquid calories, continue to practice mindfulness when eating, planning for success, avoid all or none thinking, and rethink your why .  Additional resources provided today: NA  Recommended Physical Activity Goals  Ruffus has been advised to work up to 150 minutes of moderate intensity aerobic activity a week and strengthening exercises 2-3 times per week for cardiovascular health, weight loss maintenance and preservation of muscle mass.   He has agreed to Start aerobic activity with a goal of 150 minutes a week at moderate intensity.  We set a gym goal for 3+ days/ wk  Pharmacotherapy changes for the treatment of obesity: restart Metformin  XR 750 mg daily (has RX at home)  ASSOCIATED CONDITIONS ADDRESSED TODAY  Metabolic syndrome Lab Results  Component Value Date   CHOL 183 08/04/2024   HDL 33 (L) 08/04/2024   LDLCALC 96 08/04/2024   LDLDIRECT 81.0 09/22/2023   TRIG 321 (H) 08/04/2024   CHOLHDL 5.5 (H) 08/04/2024  Diastolic blood pressure over 90  He has room for improvement with reducing added sugar, refined carbohydrates and saturated fats along with more regular exercise.  He agrees to restarting metformin  XR 750 mg once daily  Elevated liver  enzymes Assessment & Plan:  Fibrosis 4 Score = .63 (Low risk)        Interpretation for patients with NAFLD          <1.30       -  F0-F1 (Low risk)          1.30-2.67 -  Indeterminate           >2.67      -  F3-F4 (High risk)     Validated for ages 62-65       Continue active plan for weight loss and minimize alcohol intake  Vitamin D  deficiency Last vitamin D  Lab Results  Component Value Date   VD25OH 17.1 (L) 08/04/2024   Reviewed labs from  last visit.  Vitamin D  level very low.  He is not on a vitamin D  supplement and has complaints of fatigue.  Begin vitamin D  50,000 IU once weekly and repeat lab in 3 months -     Vitamin D  (Ergocalciferol ); Take 1 capsule (50,000 Units total) by mouth every 7 (seven) days.  Dispense: 12 capsule; Refill: 0  Morbid obesity (HCC) Reviewed weight goal and weight graph over the past 2+ years and medically supervised weight management Reviewed plan of care on after visit summary  Elevated blood pressure reading He agrees to setting up a follow-up visit with his PCP to discuss his lipids and blood pressure readings.  Encouraged improved compliance taking his lipid medication.  Follow-up blood pressure readings with PCP.     He was informed of the importance of frequent follow up visits to maximize his success with intensive lifestyle modifications for his multiple health conditions.   ATTESTASTION STATEMENTS:  Reviewed by clinician on day of visit: allergies, medications, problem list, medical history, surgical history, family history, social history, and previous encounter notes pertinent to obesity diagnosis.   I have personally spent 30 minutes total time today in preparation, patient care, nutritional counseling and education,  and documentation for this visit, including the following: review of most recent clinical lab tests, prescribing medications/ refilling medications, reviewing medical assistant documentation, review and interpretation of bioimpedence results.     Darice Haddock, D.O. DABFM, DABOM [facility] [address] [place], [place] [address] [phone]

## 2024-09-06 NOTE — Patient Instructions (Addendum)
 2000 calorie meal plan using AI You can swap out meals and snacks but keep calories constant  Set a workout goal for 3 days/ wk  Remember your metformin  daily w/ food Add RX vitamin D  weekly

## 2024-11-17 ENCOUNTER — Ambulatory Visit: Admitting: Family Medicine
